# Patient Record
Sex: Male | Born: 1970 | Race: Black or African American | Hispanic: No | Marital: Married | Smoking: Former smoker
Health system: Southern US, Community
[De-identification: ages and names within clinical notes are randomized; demographics above are authoritative.]

---

## 2016-11-26 ENCOUNTER — Emergency Department (HOSPITAL_BASED_OUTPATIENT_CLINIC_OR_DEPARTMENT_OTHER): Payer: Worker's Compensation

## 2016-11-26 ENCOUNTER — Emergency Department (HOSPITAL_BASED_OUTPATIENT_CLINIC_OR_DEPARTMENT_OTHER)
Admission: EM | Admit: 2016-11-26 | Discharge: 2016-11-26 | Disposition: A | Discharge status: Home / Self Care | Payer: Worker's Compensation | Attending: Emergency Medicine | Admitting: Emergency Medicine

## 2016-11-26 ENCOUNTER — Encounter (HOSPITAL_BASED_OUTPATIENT_CLINIC_OR_DEPARTMENT_OTHER): Payer: Self-pay

## 2016-11-26 DIAGNOSIS — R0789 Other chest pain: Secondary | ICD-10-CM | POA: Insufficient documentation

## 2016-11-26 DIAGNOSIS — R072 Precordial pain: Secondary | ICD-10-CM | POA: Diagnosis present

## 2016-11-26 DIAGNOSIS — F17290 Nicotine dependence, other tobacco product, uncomplicated: Secondary | ICD-10-CM | POA: Diagnosis not present

## 2016-11-26 DIAGNOSIS — M549 Dorsalgia, unspecified: Secondary | ICD-10-CM | POA: Diagnosis not present

## 2016-11-26 LAB — CBC
HCT: 40.4 % (ref 39.0–52.0)
Hemoglobin: 14 g/dL (ref 13.0–17.0)
MCH: 29.5 pg (ref 26.0–34.0)
MCHC: 34.7 g/dL (ref 30.0–36.0)
MCV: 85.1 fL (ref 78.0–100.0)
PLATELETS: 222 10*3/uL (ref 150–400)
RBC: 4.75 MIL/uL (ref 4.22–5.81)
RDW: 12.6 % (ref 11.5–15.5)
WBC: 5.7 10*3/uL (ref 4.0–10.5)

## 2016-11-26 LAB — BASIC METABOLIC PANEL
Anion gap: 6 (ref 5–15)
BUN: 13 mg/dL (ref 6–20)
CHLORIDE: 105 mmol/L (ref 101–111)
CO2: 26 mmol/L (ref 22–32)
CREATININE: 0.99 mg/dL (ref 0.61–1.24)
Calcium: 8.7 mg/dL — ABNORMAL LOW (ref 8.9–10.3)
GFR calc non Af Amer: >60 mL/min (ref >60)
Glucose, Bld: 104 mg/dL — ABNORMAL HIGH (ref 65–99)
Potassium: 3.9 mmol/L (ref 3.5–5.1)
SODIUM: 137 mmol/L (ref 135–145)

## 2016-11-26 LAB — TROPONIN I: Troponin I: <0.03 ng/mL (ref <0.03)

## 2016-11-26 IMAGING — CT CT ANGIO CHEST
2 of 8 series · 19 of 36 positions shown · IV contrast (isovue)
Comparison: [DATE] CXR

CLINICAL DATA: Chest pain and back pain today.  Nonsmoker.

EXAM:
CT ANGIOGRAPHY CHEST WITH CONTRAST
TECHNIQUE: Multidetector CT imaging of the chest was performed using the
standard protocol during bolus administration of intravenous
contrast. Multiplanar CT image reconstructions and MIPs were
obtained to evaluate the vascular anatomy.
CONTRAST:  100 cc Isovue 370 IV

[Series 6: pe coronal mpr · coronal · 0.56mm/px · 1 of 129 slices shown]
[im 65/129  mediastinal]
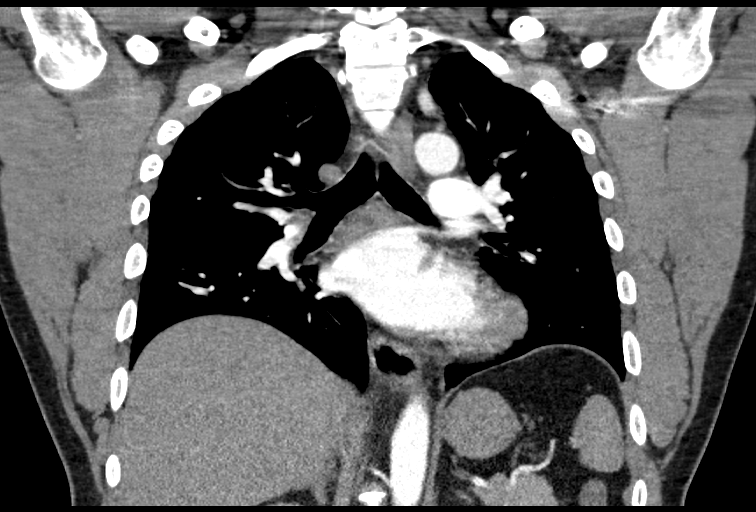

[Series 10: pe thins · axial · 0.67mm/px · z∈[-284,-36]mm · 18 of 278 slices shown]
[im 15/278  lung]
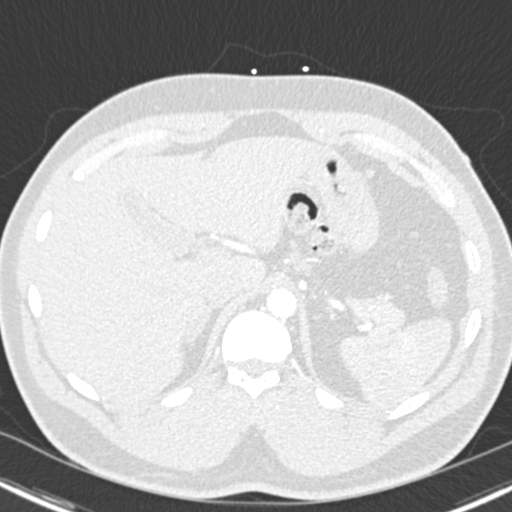
[im 30/278  mediastinal]
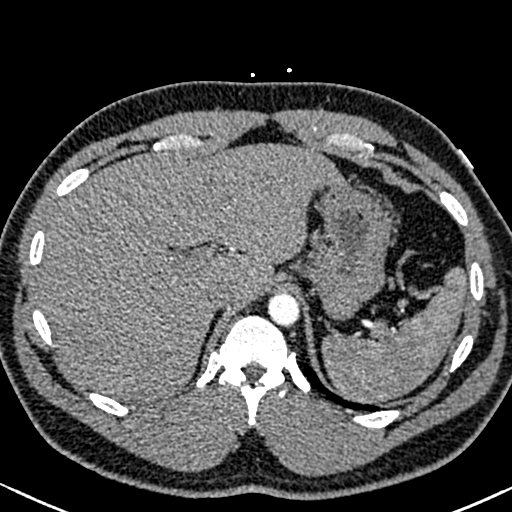
[im 44/278  lung]
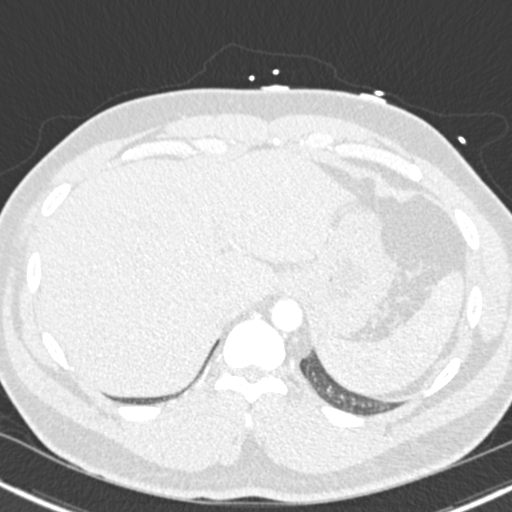
[im 59/278  mediastinal]
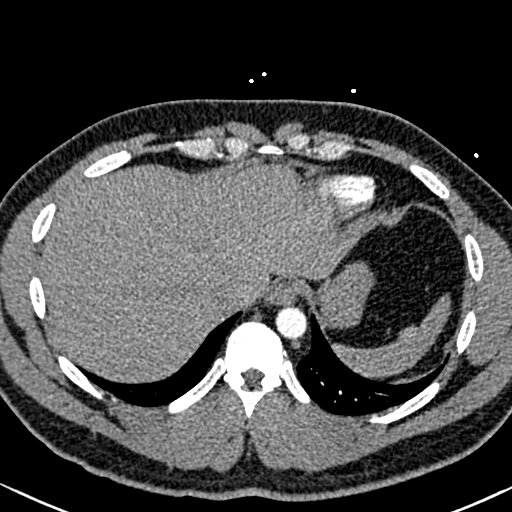
[im 73/278  lung]
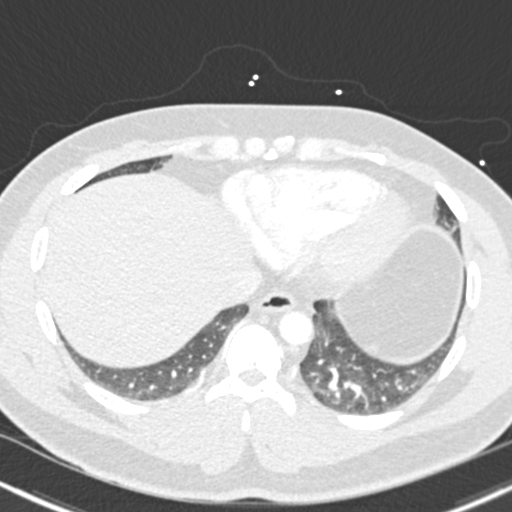
[im 88/278  mediastinal]
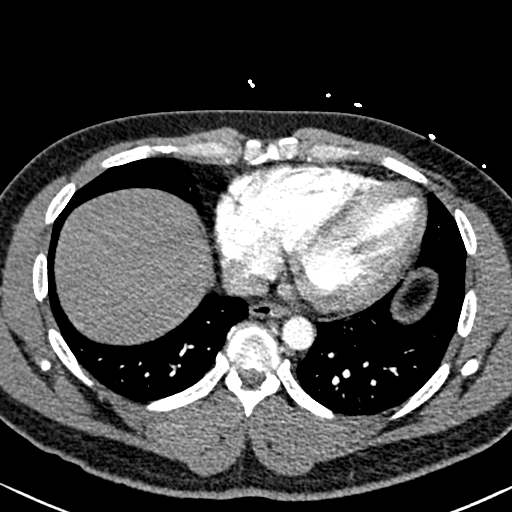
[im 103/278  lung]
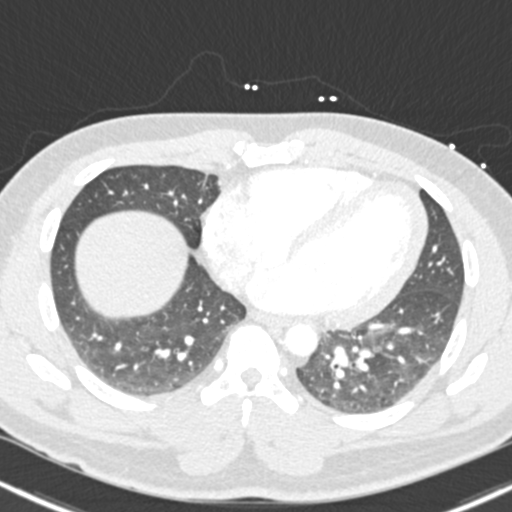
[im 117/278  mediastinal]
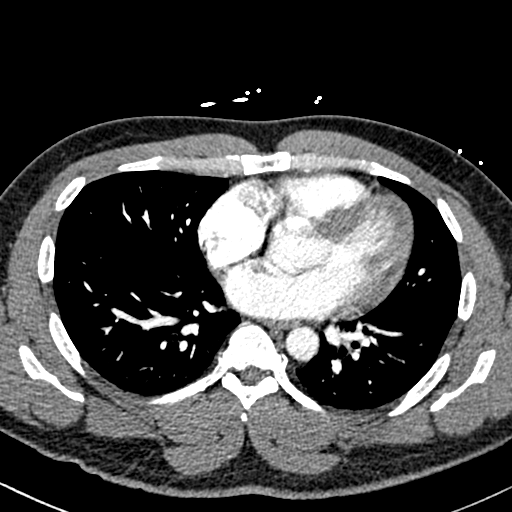
[im 132/278  lung]
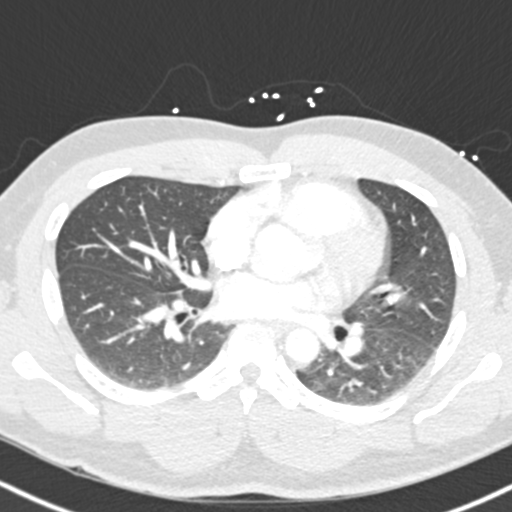
[im 146/278  mediastinal]
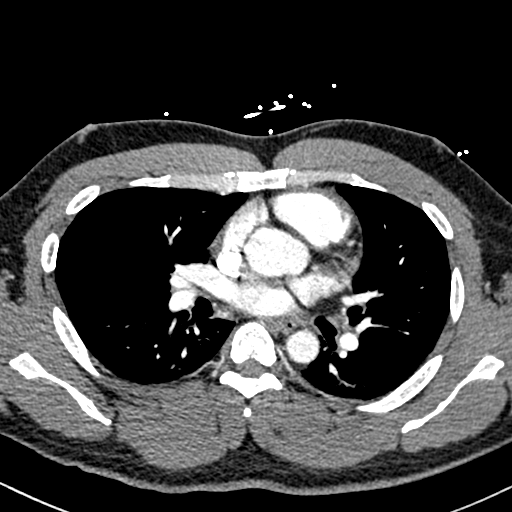
[im 161/278  lung]
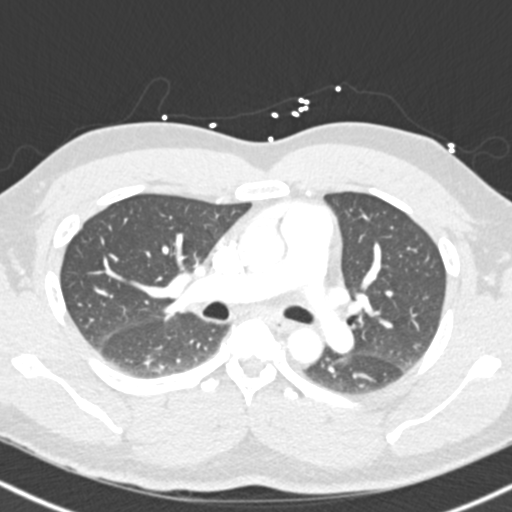
[im 175/278  mediastinal]
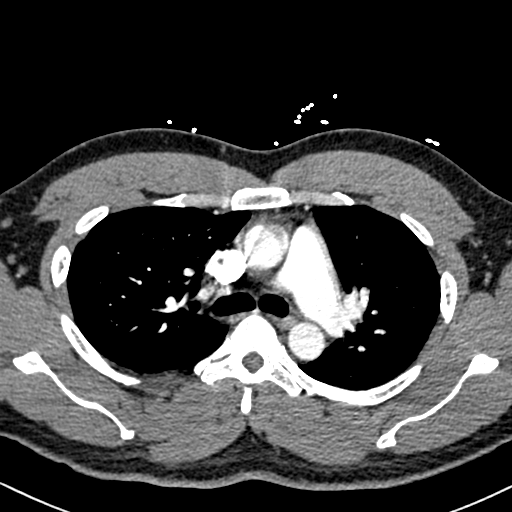
[im 190/278  lung]
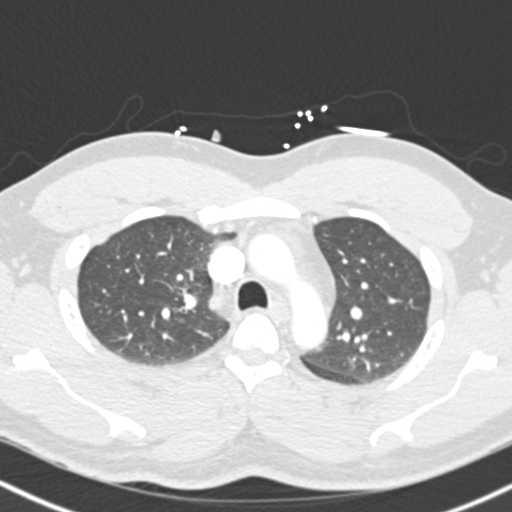
[im 205/278  mediastinal]
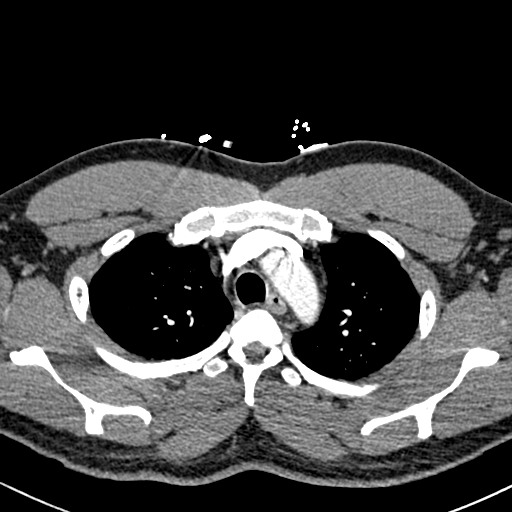
[im 219/278  lung]
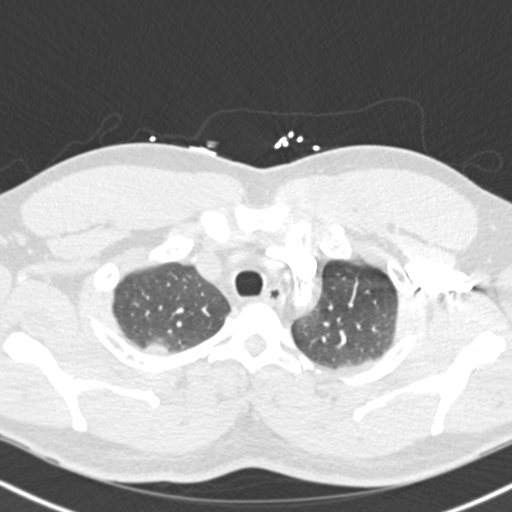
[im 234/278  mediastinal]
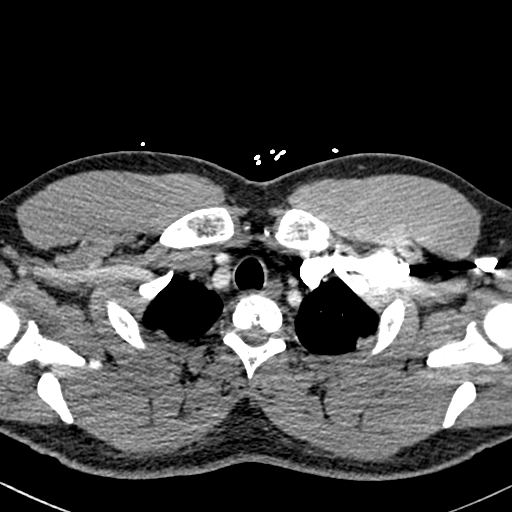
[im 248/278  lung]
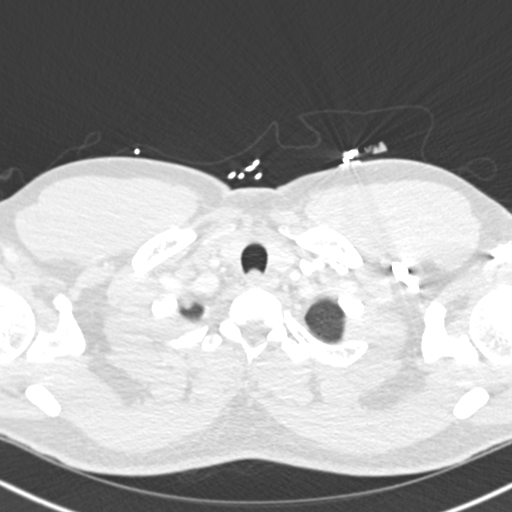
[im 263/278  mediastinal]
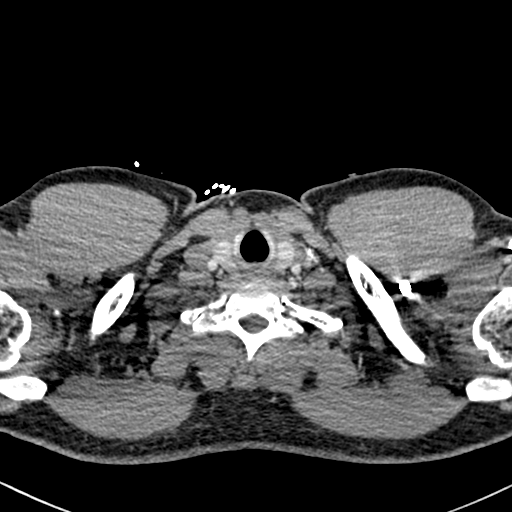

[19 of 36 positions shown; findings below may reference images not displayed]

FINDINGS: Cardiovascular: Negative for acute pulmonary embolus. Normal size
cardiac chambers. No pericardial effusion. No thoracic aortic
aneurysm or dissection.

Mediastinum/Nodes: No enlarged mediastinal, hilar, or axillary lymph
nodes. Thyroid gland, trachea, and esophagus demonstrate no
significant findings.

Lungs/Pleura: Lungs demonstrate bibasilar dependent atelectasis. No
pneumonic consolidation, effusion or pneumothorax. No dominant mass.

Upper Abdomen: No acute abnormality.

Musculoskeletal: No chest wall abnormality. No acute or significant
osseous findings.

Review of the MIP images confirms the above findings.
IMPRESSION: No acute cardiothoracic abnormality. No evidence of acute pulmonary
embolus.

## 2016-11-26 IMAGING — CR DG CHEST 2V
2 series · 2 of 2 positions shown · non-contrast
Comparison: None in PACs

CLINICAL DATA: Onset of left-sided chest pain this morning with
radiation between the shoulder blades.

EXAM:
CHEST  2 VIEW

[w chest pa]
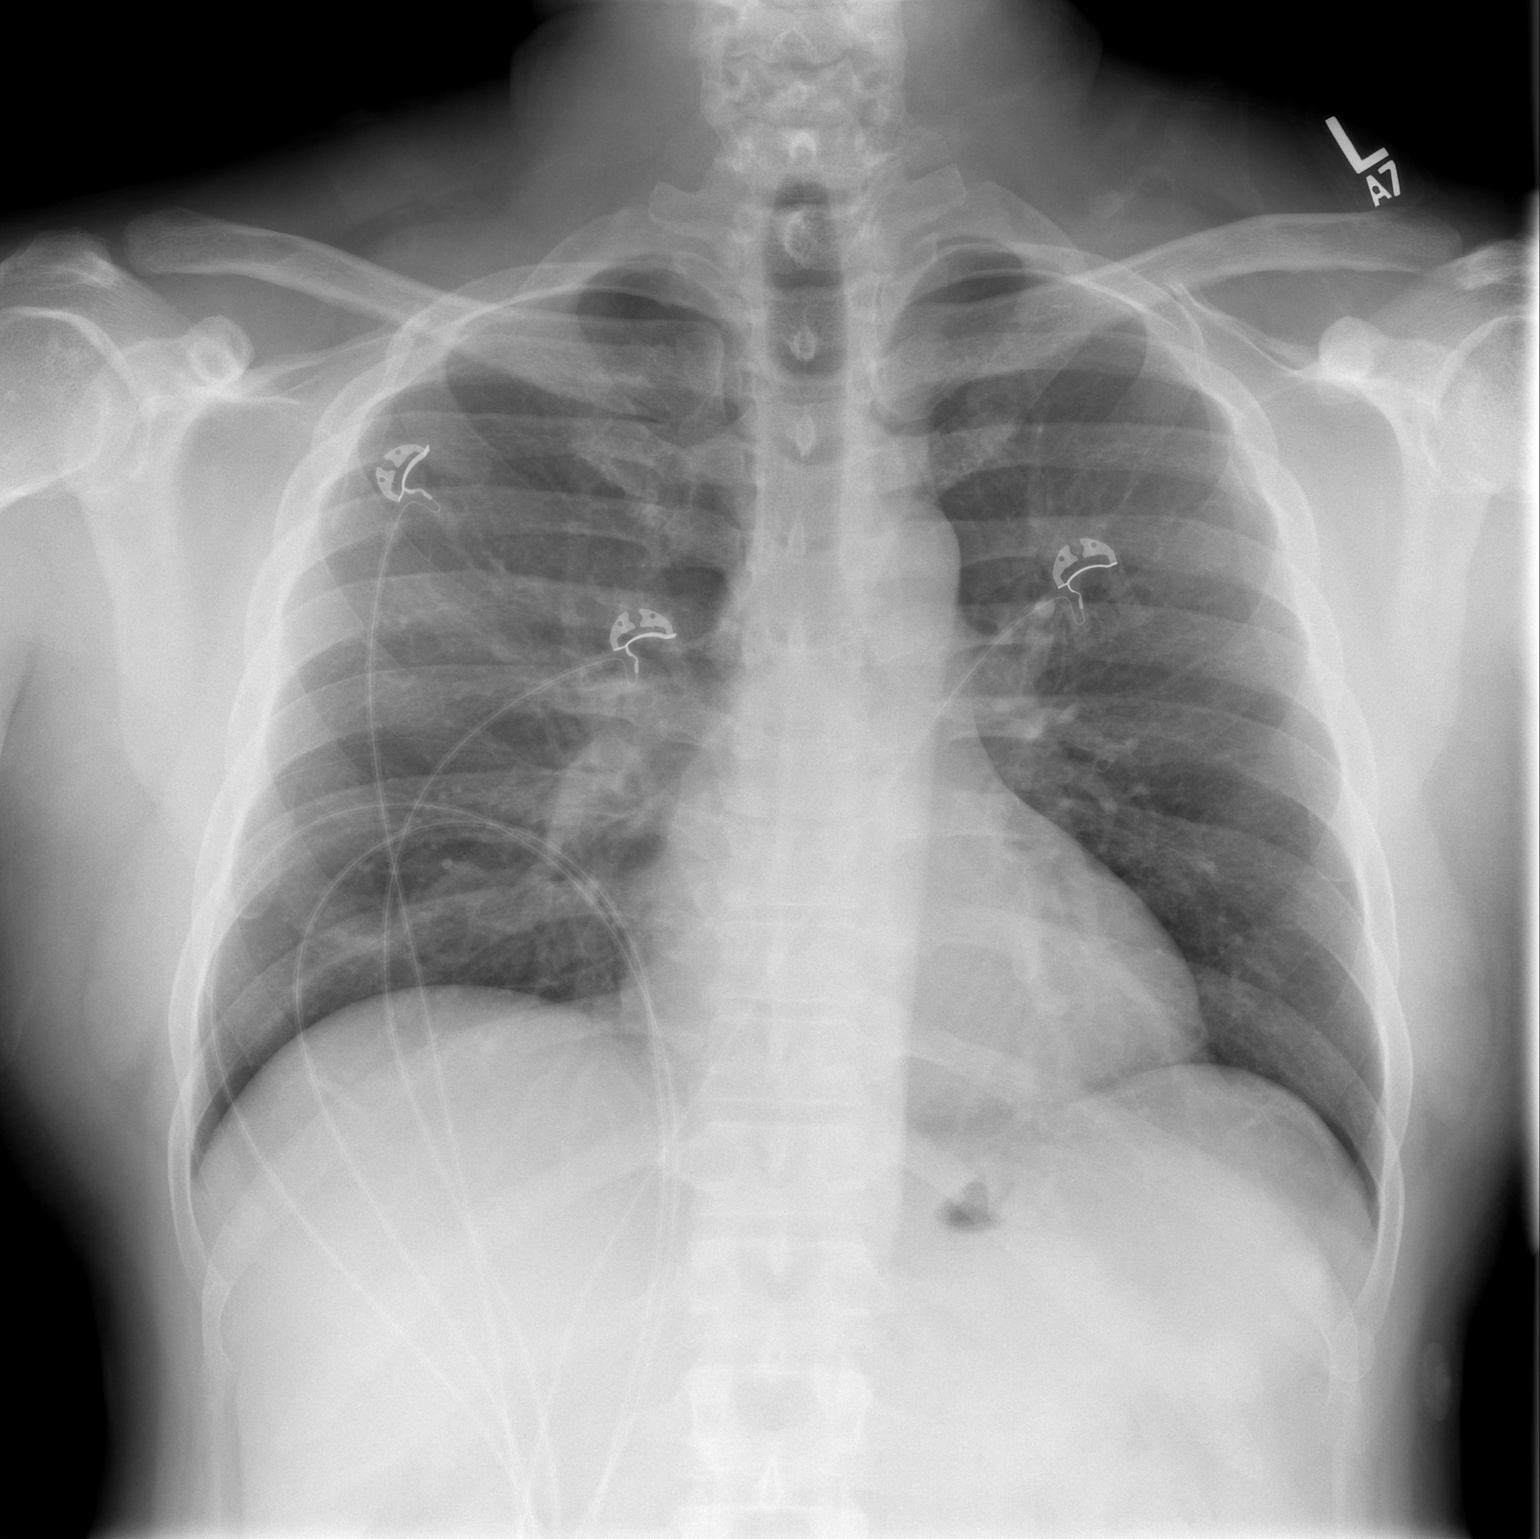

[w chest lat]
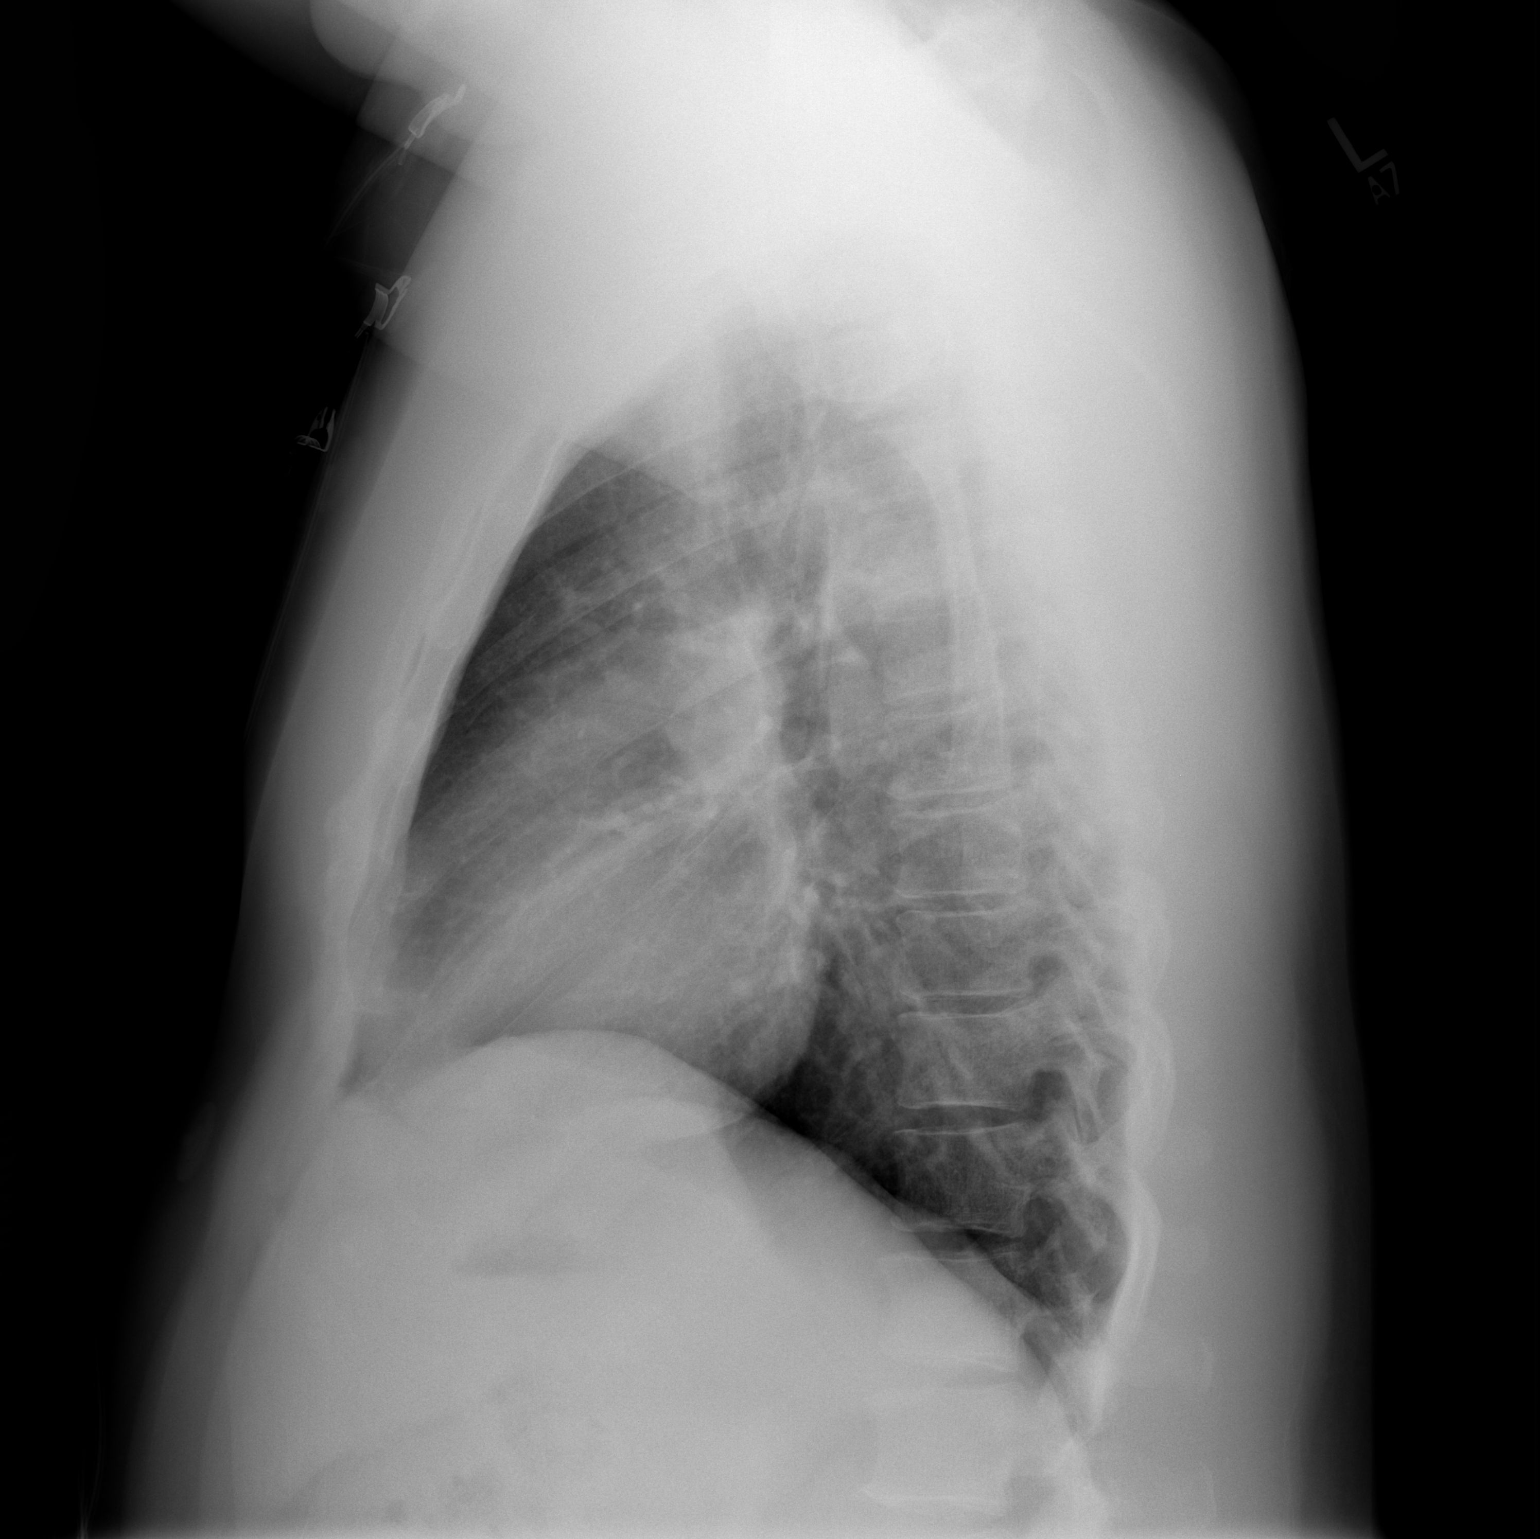

[2 of 2 positions shown; findings below may reference images not displayed]

FINDINGS: The lungs are adequately inflated. There is subtle density just
above the right hemidiaphragm. There is no pneumothorax or pleural
effusion. The heart and pulmonary vascularity are normal. The
mediastinum is normal in width.
IMPRESSION: No definite acute cardiopulmonary abnormality. Linear density just
above the dome of the right hemidiaphragm likely reflects scarring
though subsegmental atelectasis is not excluded. If the patient's
symptoms persists, chest CT scanning would be a useful next imaging
step.

## 2016-11-26 MED ORDER — NAPROXEN 500 MG PO TABS: 500.0000 mg | ORAL_TABLET | Freq: Two times a day (BID) | ORAL | 0 refills | Status: AC

## 2016-11-26 MED ORDER — IOPAMIDOL (ISOVUE-370) INJECTION 76%
100.0000 mL | Freq: Once | INTRAVENOUS | Status: AC | PRN
  Administered 2016-11-26: 100 mL via INTRAVENOUS

## 2016-11-26 MED ORDER — SODIUM CHLORIDE 0.9 % IV SOLN
INTRAVENOUS | Status: DC
  Administered 2016-11-26: 15:00:00 via INTRAVENOUS

## 2016-11-26 MED FILL — NAPROXEN 500 MG TABLET: 500 | 7 days supply | Qty: 14 | Fill #0

## 2016-11-26 NOTE — ED Provider Notes (Signed)
[facility]   CSN: [id number] Arrival date & time: 11/26/16  1227     History   Chief Complaint Chief Complaint  Patient presents with  . Chest Pain    HPI Brad Dennis is a 46 y.o. male.  Patient with onset of left-sided substernal chest pain at 8 this morning radiates to the back. Made worse with taking deep breaths and movement of his left arm. No nausea vomiting no diaphoresis. Questionable some shortness of breath. No history of injury. Past medical history noncontributory. Patient is a cigar smoker. No history of similar pain.      History reviewed. No pertinent past medical history.  There are no active problems to display for this patient.   History reviewed. No pertinent surgical history.     Home Medications    Prior to Admission medications   Medication Sig Start Date End Date Taking? Authorizing Provider  naproxen (NAPROSYN) 500 MG tablet Take 1 tablet (500 mg total) by mouth 2 (two) times daily. 11/26/16   Vanetta Mulders, MD    Family History No family history on file.  Social History Social History  Substance Use Topics  . Smoking status: Current Some Day Smoker    Types: Cigars  . Smokeless tobacco: Never Used  . Alcohol use No     Allergies   Patient has no known allergies.   Review of Systems Review of Systems  Constitutional: Negative for fever.  HENT: Negative for congestion.   Eyes: Negative for visual disturbance.  Respiratory: Negative for shortness of breath.   Cardiovascular: Positive for chest pain. Negative for leg swelling.  Gastrointestinal: Negative for abdominal pain, nausea and vomiting.  Genitourinary: Negative for dysuria.  Musculoskeletal: Positive for back pain.  Neurological: Negative for headaches.  Hematological: Does not bruise/bleed easily.  Psychiatric/Behavioral: Negative for confusion.     Physical Exam Updated Vital Signs BP 134/82 (BP Location: Right Arm)   Pulse  66   Temp 98.8 F (37.1 C) (Oral)   Resp 18   Ht 1.829 m (6')   Wt 103 kg (227 lb)   SpO2 100%   BMI 30.79 kg/m   Physical Exam  Constitutional: He is oriented to person, place, and time. He appears well-developed and well-nourished. No distress.  HENT:  Head: Normocephalic and atraumatic.  Mouth/Throat: Oropharynx is clear and moist.  Eyes: Conjunctivae and EOM are normal. Pupils are equal, round, and reactive to light.  Neck: Normal range of motion. Neck supple.  Cardiovascular: Normal rate, regular rhythm and normal heart sounds.   Pulmonary/Chest: Effort normal and breath sounds normal. No respiratory distress. He exhibits no tenderness.  Abdominal: Soft. Bowel sounds are normal. There is no tenderness.  Musculoskeletal: Normal range of motion. He exhibits no edema.  Neurological: He is alert and oriented to person, place, and time. No cranial nerve deficit or sensory deficit. He exhibits normal muscle tone. Coordination normal.  Skin: Skin is warm.  Nursing note and vitals reviewed.    ED Treatments / Results  Labs (all labs ordered are listed, but only abnormal results are displayed) Labs Reviewed  BASIC METABOLIC PANEL - Abnormal; Notable for the following:       Result Value   Glucose, Bld 104 (*)    Calcium 8.7 (*)    All other components within normal limits  CBC  TROPONIN I    EKG  EKG Interpretation  Date/Time:  Tuesday Nov 26 2016 12:36:16 EDT Ventricular Rate:  67 PR Interval:  162 QRS Duration: 96 QT Interval:  390 QTC Calculation: 412 R Axis:   59 Text Interpretation:  Normal sinus rhythm Cannot rule out Anterior infarct , age undetermined Abnormal ECG No previous ECGs available Confirmed by Vanetta MuldersZackowski, Shayn Madole [phone]([id number]) on 11/26/2016 12:37:22 PM       Radiology Dg Chest 2 View  Result Date: 11/26/2016 CLINICAL DATA:  Onset of left-sided chest pain this morning with radiation between the shoulder blades. EXAM: CHEST  2 VIEW COMPARISON:  None in PACs  FINDINGS: The lungs are adequately inflated. There is subtle density just above the right hemidiaphragm. There is no pneumothorax or pleural effusion. The heart and pulmonary vascularity are normal. The mediastinum is normal in width. IMPRESSION: No definite acute cardiopulmonary abnormality. Linear density just above the dome of the right hemidiaphragm likely reflects scarring though subsegmental atelectasis is not excluded. If the patient's symptoms persists, chest CT scanning would be a useful next imaging step. Electronically Signed   By: David  SwazilandJordan M.D.   On: 11/26/2016 12:54   Ct Angio Chest Pe W/cm &/or Wo Cm  Result Date: 11/26/2016 CLINICAL DATA:  Chest pain and back pain today.  Nonsmoker. EXAM: CT ANGIOGRAPHY CHEST WITH CONTRAST TECHNIQUE: Multidetector CT imaging of the chest was performed using the standard protocol during bolus administration of intravenous contrast. Multiplanar CT image reconstructions and MIPs were obtained to evaluate the vascular anatomy. CONTRAST:  100 cc Isovue 370 IV COMPARISON:  11/26/2016 CXR FINDINGS: Cardiovascular: Negative for acute pulmonary embolus. Normal size cardiac chambers. No pericardial effusion. No thoracic aortic aneurysm or dissection. Mediastinum/Nodes: No enlarged mediastinal, hilar, or axillary lymph nodes. Thyroid gland, trachea, and esophagus demonstrate no significant findings. Lungs/Pleura: Lungs demonstrate bibasilar dependent atelectasis. No pneumonic consolidation, effusion or pneumothorax. No dominant mass. Upper Abdomen: No acute abnormality. Musculoskeletal: No chest wall abnormality. No acute or significant osseous findings. Review of the MIP images confirms the above findings. IMPRESSION: No acute cardiothoracic abnormality. No evidence of acute pulmonary embolus. Electronically Signed   By: Tollie Ethavid  Kwon M.D.   On: 11/26/2016 14:47    Procedures Procedures (including critical care time)  Medications Ordered in ED Medications  0.9 %   sodium chloride infusion ( Intravenous New Bag/Given 11/26/16 1436)  iopamidol (ISOVUE-370) 76 % injection 100 mL (100 mLs Intravenous Contrast Given 11/26/16 1425)     Initial Impression / Assessment and Plan / ED Course  I have reviewed the triage vital signs and the nursing notes.  Pertinent labs & imaging results that were available during my care of the patient were reviewed by me and considered in my medical decision making (see chart for details).    The patient has left-sided chest pain. Made worse by movement of his left arm or by taking a deep breath. First troponin negative EKG without acute changes. Not concerned about an acute cardiac event. Initial concern was more for possible pulmonary embolus. Since chest x-ray raises some concern for scarring of the lung CT angios was done. No evidence of pulmonary embolus.  Patient was treated as chest wall pain with Naprosyn and a work note. He'll return for any new or worse symptoms.   Final Clinical Impressions(s) / ED Diagnoses   Final diagnoses:  Chest wall pain    New Prescriptions New Prescriptions   NAPROXEN (NAPROSYN) 500 MG TABLET    Take 1 tablet (500 mg total) by mouth 2 (two) times daily.     Vanetta MuldersZackowski, Viraat Vanpatten, MD 11/26/16 [phone]

## 2016-11-26 NOTE — ED Notes (Signed)
Patient transported to CT 

## 2016-11-26 NOTE — ED Notes (Signed)
Patient transported to X-ray 

## 2016-11-26 NOTE — ED Triage Notes (Signed)
C/o CP x today-NAD-steady gait 

## 2016-11-26 NOTE — Discharge Instructions (Signed)
Work note provided. Workup for the chest pain without evidence of any heart problem or blood clots in the lungs or any significant lung abnormalities. Seems to be consistent with chest wall pain. Take the Naprosyn as directed for the next few days. Return for any new or worse symptoms.

## 2017-10-15 ENCOUNTER — Ambulatory Visit (INDEPENDENT_AMBULATORY_CARE_PROVIDER_SITE_OTHER): Payer: Self-pay

## 2017-10-15 ENCOUNTER — Other Ambulatory Visit: Payer: Self-pay | Admitting: Gerontology

## 2017-10-15 DIAGNOSIS — R0789 Other chest pain: Secondary | ICD-10-CM

## 2017-10-15 DIAGNOSIS — R52 Pain, unspecified: Secondary | ICD-10-CM

## 2017-10-15 IMAGING — DX DG RIBS W/ CHEST 3+V*R*
3 series · 3 of 3 positions shown · non-contrast
Comparison: [DATE]

CLINICAL DATA: Fell today with right-sided chest wall pain.

EXAM:
RIGHT RIBS AND CHEST - 3+ VIEW

[chest pa]
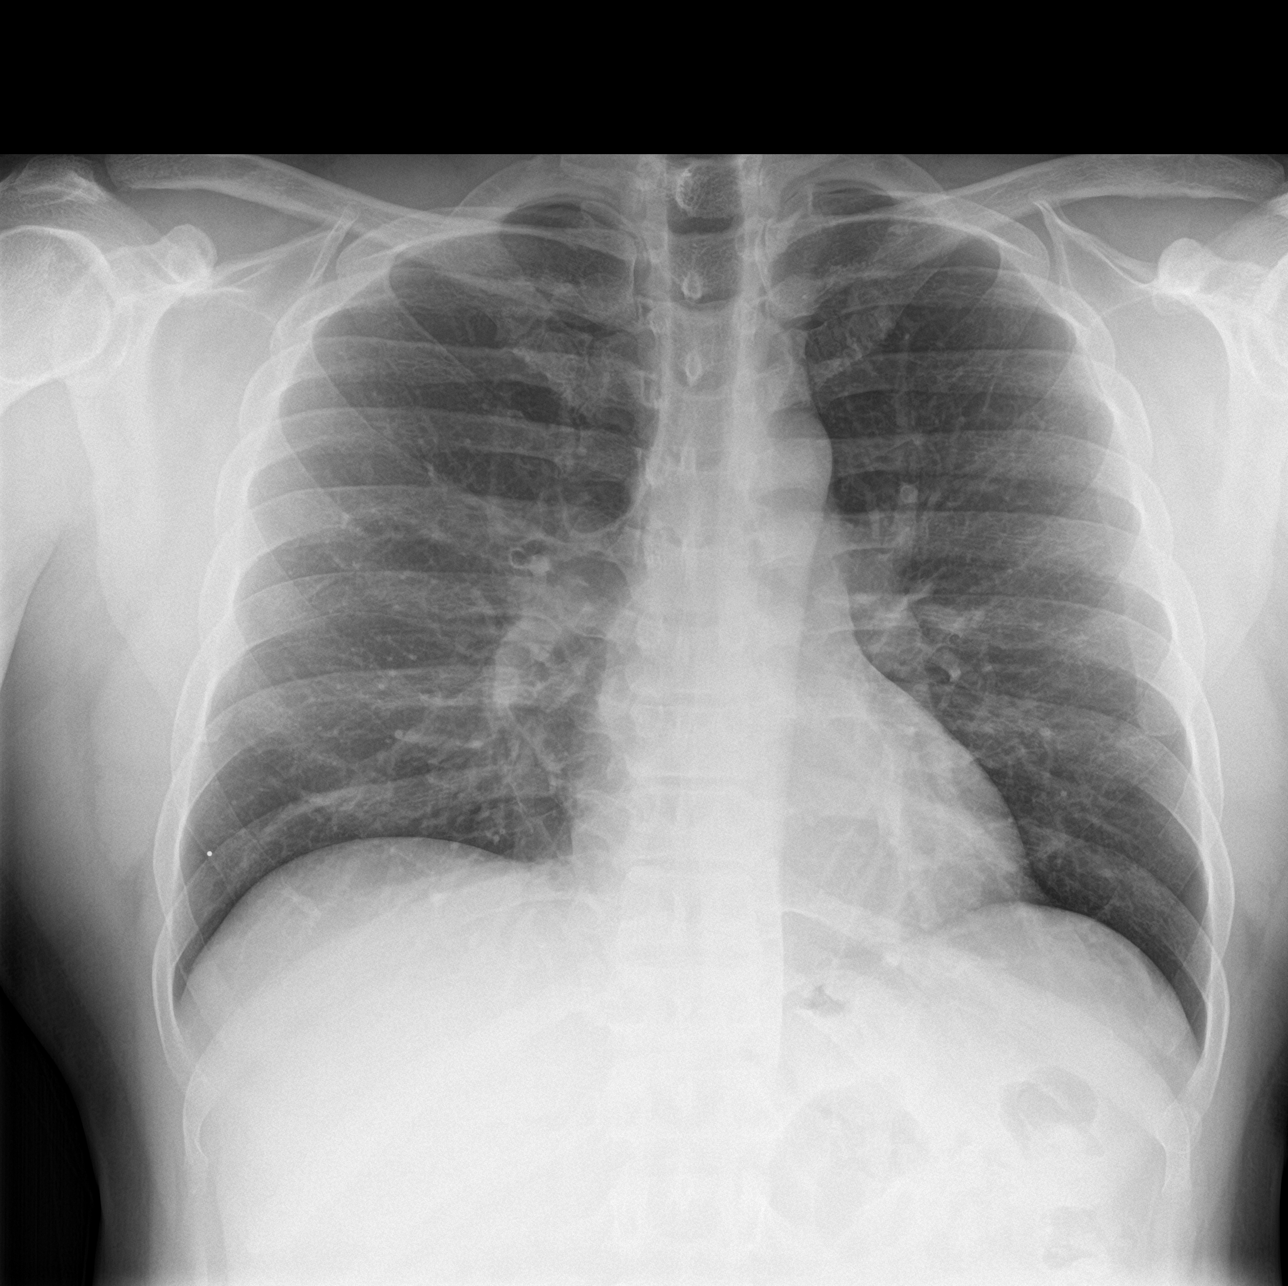

[rib pa]
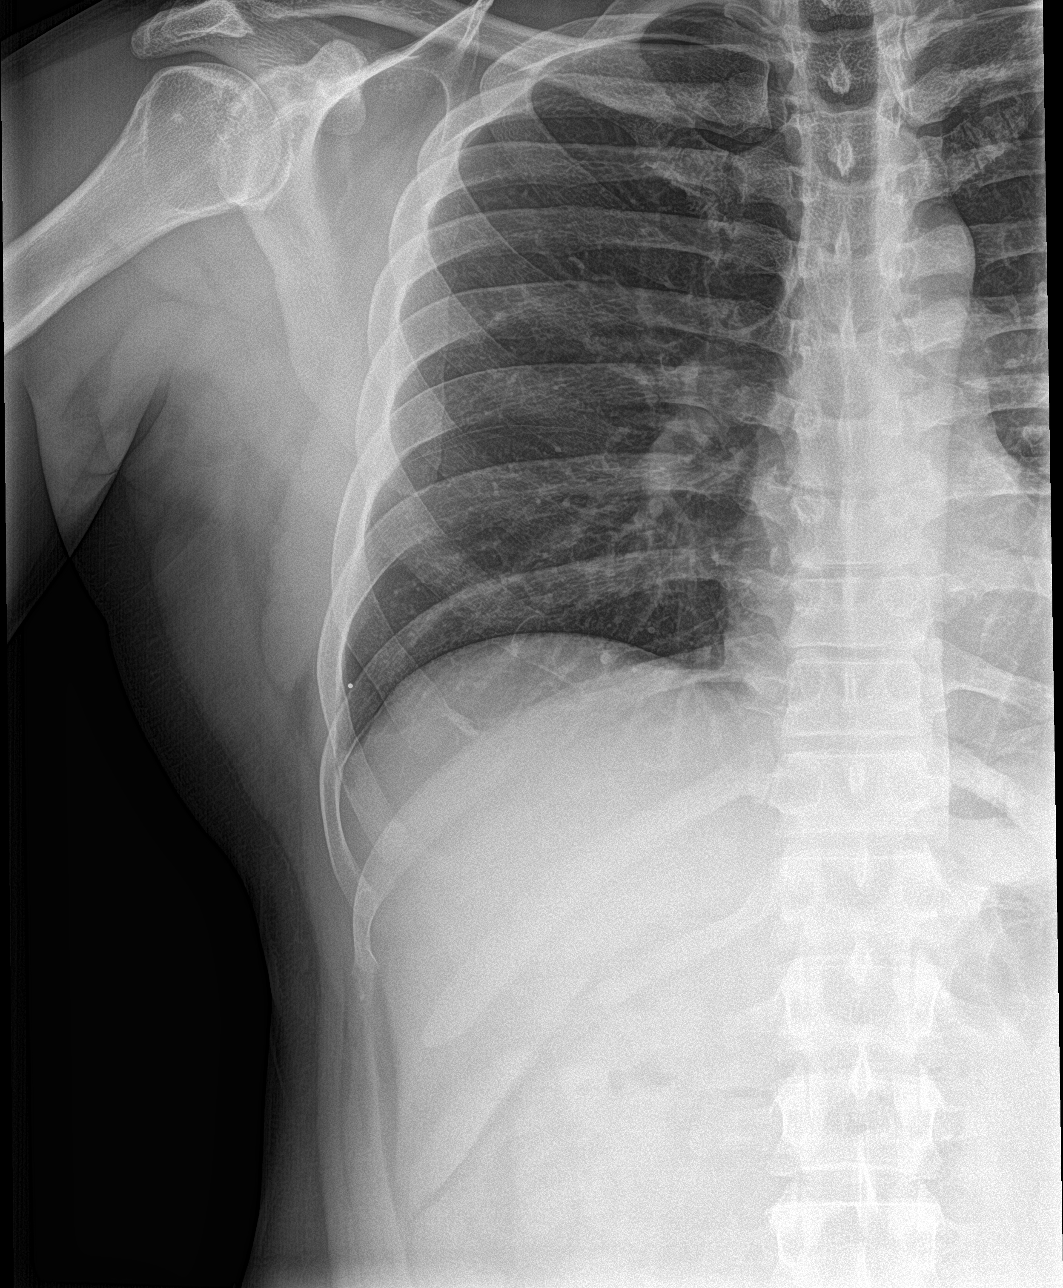

[rib pa obl]
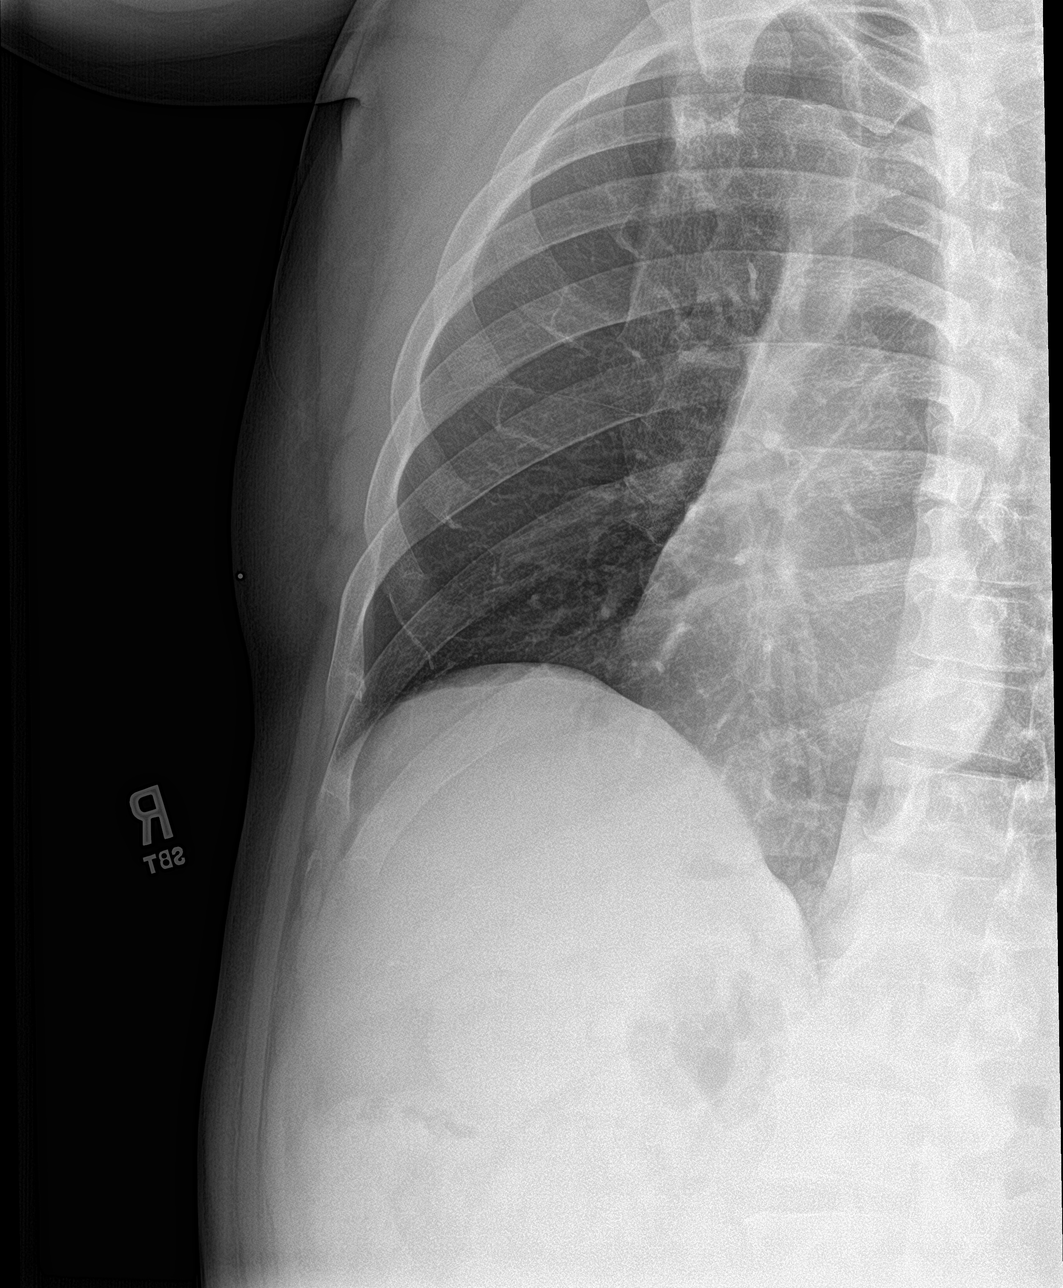

[3 of 3 positions shown; findings below may reference images not displayed]

FINDINGS: Heart size is normal. Mediastinal shadows are normal. The lungs are
clear. No pneumothorax or hemothorax. No evidence of right rib
fracture. Marker placed in the region of concern.
IMPRESSION: Normal radiographs.

## 2018-09-11 ENCOUNTER — Emergency Department (HOSPITAL_BASED_OUTPATIENT_CLINIC_OR_DEPARTMENT_OTHER)
Admission: EM | Admit: 2018-09-11 | Discharge: 2018-09-11 | Disposition: A | Discharge status: Home / Self Care | Payer: BLUE CROSS/BLUE SHIELD | Attending: Emergency Medicine | Admitting: Emergency Medicine

## 2018-09-11 ENCOUNTER — Other Ambulatory Visit: Payer: Self-pay

## 2018-09-11 ENCOUNTER — Encounter (HOSPITAL_BASED_OUTPATIENT_CLINIC_OR_DEPARTMENT_OTHER): Payer: Self-pay | Admitting: Emergency Medicine

## 2018-09-11 ENCOUNTER — Emergency Department (HOSPITAL_BASED_OUTPATIENT_CLINIC_OR_DEPARTMENT_OTHER): Payer: BLUE CROSS/BLUE SHIELD

## 2018-09-11 DIAGNOSIS — K529 Noninfective gastroenteritis and colitis, unspecified: Secondary | ICD-10-CM

## 2018-09-11 DIAGNOSIS — F17290 Nicotine dependence, other tobacco product, uncomplicated: Secondary | ICD-10-CM | POA: Diagnosis not present

## 2018-09-11 DIAGNOSIS — A09 Infectious gastroenteritis and colitis, unspecified: Secondary | ICD-10-CM

## 2018-09-11 DIAGNOSIS — R1032 Left lower quadrant pain: Secondary | ICD-10-CM | POA: Diagnosis present

## 2018-09-11 LAB — URINALYSIS, ROUTINE W REFLEX MICROSCOPIC
Bilirubin Urine: NEGATIVE
GLUCOSE, UA: NEGATIVE mg/dL
KETONES UR: NEGATIVE mg/dL
LEUKOCYTE UA: NEGATIVE
NITRITE: NEGATIVE
PH: 6 (ref 5.0–8.0)
Protein, ur: NEGATIVE mg/dL
SPECIFIC GRAVITY, URINE: 1.02 (ref 1.005–1.030)

## 2018-09-11 LAB — COMPREHENSIVE METABOLIC PANEL
ALT: 30 U/L (ref 0–44)
AST: 21 U/L (ref 15–41)
Albumin: 4.2 g/dL (ref 3.5–5.0)
Alkaline Phosphatase: 90 U/L (ref 38–126)
Anion gap: 6 (ref 5–15)
BUN: 9 mg/dL (ref 6–20)
CHLORIDE: 106 mmol/L (ref 98–111)
CO2: 24 mmol/L (ref 22–32)
CREATININE: 0.95 mg/dL (ref 0.61–1.24)
Calcium: 8.8 mg/dL — ABNORMAL LOW (ref 8.9–10.3)
GFR calc Af Amer: >60 mL/min (ref >60)
Glucose, Bld: 105 mg/dL — ABNORMAL HIGH (ref 70–99)
POTASSIUM: 3.4 mmol/L — AB (ref 3.5–5.1)
SODIUM: 136 mmol/L (ref 135–145)
Total Bilirubin: 1.1 mg/dL (ref 0.3–1.2)
Total Protein: 7.4 g/dL (ref 6.5–8.1)

## 2018-09-11 LAB — C DIFFICILE QUICK SCREEN W PCR REFLEX
C DIFFICILE (CDIFF) INTERP: NOT DETECTED
C DIFFICLE (CDIFF) ANTIGEN: NEGATIVE
C Diff toxin: NEGATIVE

## 2018-09-11 LAB — URINALYSIS, MICROSCOPIC (REFLEX): SQUAMOUS EPITHELIAL / LPF: NONE SEEN (ref 0–5)

## 2018-09-11 LAB — CBC
HEMATOCRIT: 45 % (ref 39.0–52.0)
HEMOGLOBIN: 14.7 g/dL (ref 13.0–17.0)
MCH: 28.4 pg (ref 26.0–34.0)
MCHC: 32.7 g/dL (ref 30.0–36.0)
MCV: 86.9 fL (ref 80.0–100.0)
NRBC: 0 % (ref 0.0–0.2)
Platelets: 253 10*3/uL (ref 150–400)
RBC: 5.18 MIL/uL (ref 4.22–5.81)
RDW: 12.3 % (ref 11.5–15.5)
WBC: 10.8 10*3/uL — AB (ref 4.0–10.5)

## 2018-09-11 LAB — OCCULT BLOOD X 1 CARD TO LAB, STOOL: FECAL OCCULT BLD: POSITIVE — AB

## 2018-09-11 LAB — LIPASE, BLOOD: LIPASE: 28 U/L (ref 11–51)

## 2018-09-11 IMAGING — CT CT ABDOMEN AND PELVIS WITH CONTRAST
2 of 5 series · 16 of 46 positions shown, 18 images · IV contrast (APPLIED)
Comparison: [DATE]

CLINICAL DATA: Left lower quadrant pain for 2 weeks and diarrhea
beginning after receiving antibiotics for a root canal.

EXAM:
CT ABDOMEN AND PELVIS WITH CONTRAST
TECHNIQUE: Multidetector CT imaging of the abdomen and pelvis was performed
using the standard protocol following bolus administration of
intravenous contrast.
CONTRAST:  100mL OMNIPAQUE IOHEXOL 300 MG/ML  SOLN

[Series 2: axial st · axial · 0.92mm/px · z∈[-467,-17]mm · 13 of 101 slices shown, 15 images]
[im 6/101  soft-tissue]
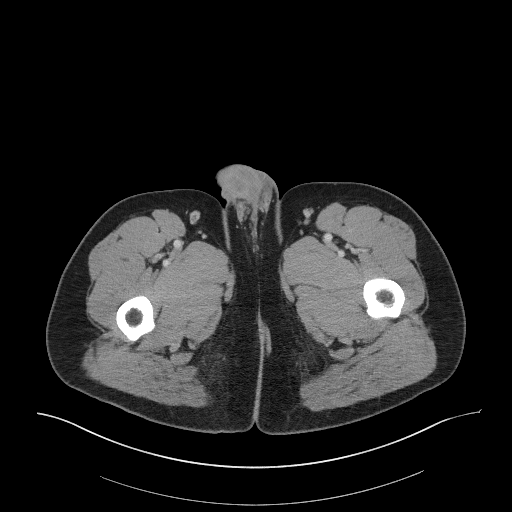
[im 6/101  bone]
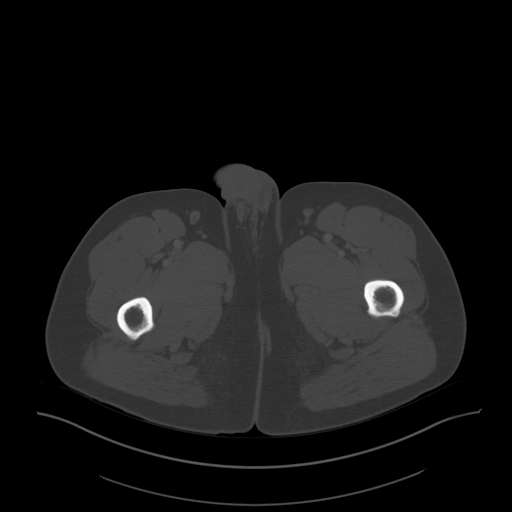
[im 16/101  soft-tissue]
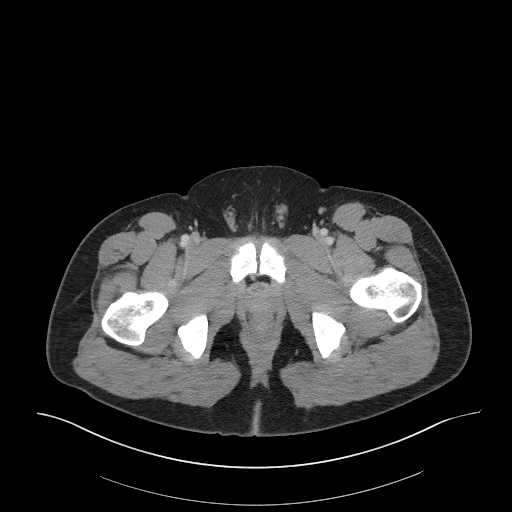
[im 21/101  soft-tissue]
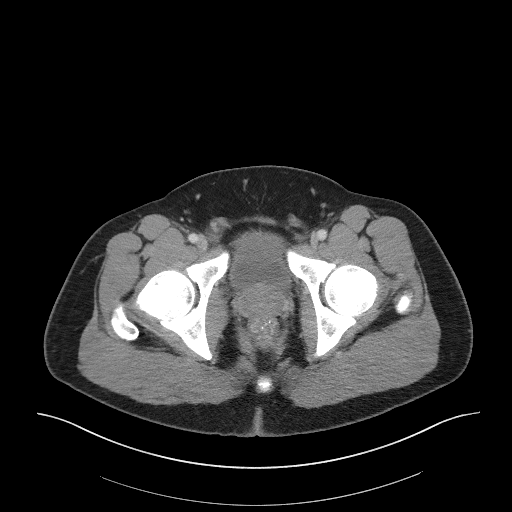
[im 31/101  soft-tissue]
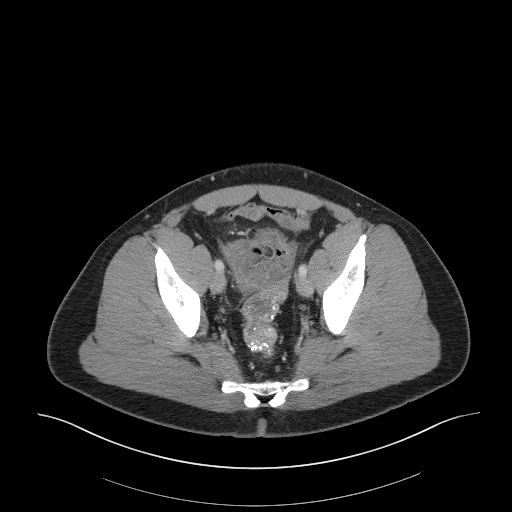
[im 36/101  soft-tissue]
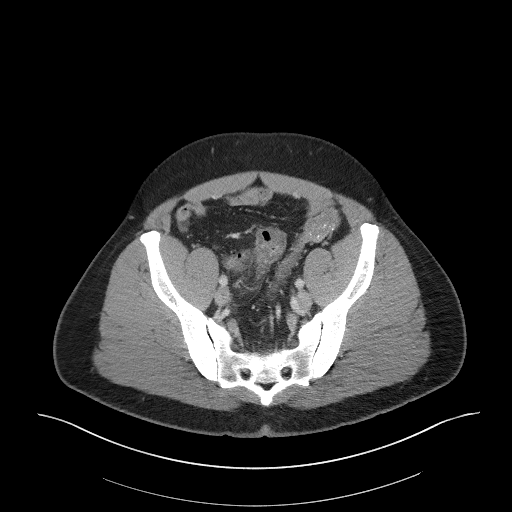
[im 46/101  soft-tissue]
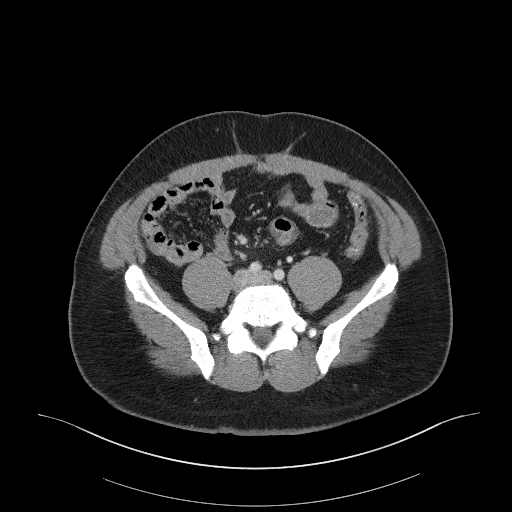
[im 51/101  soft-tissue]
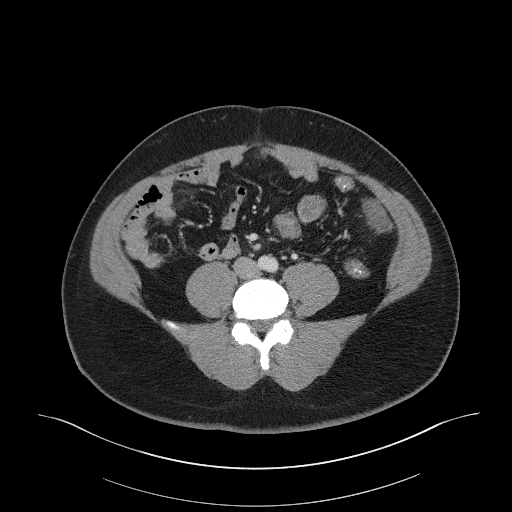
[im 56/101  soft-tissue]
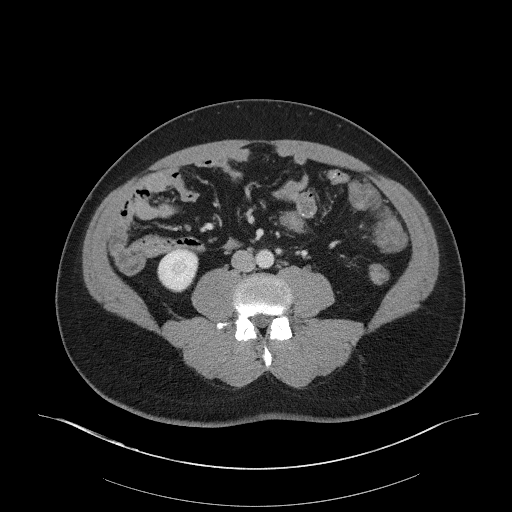
[im 66/101  soft-tissue]
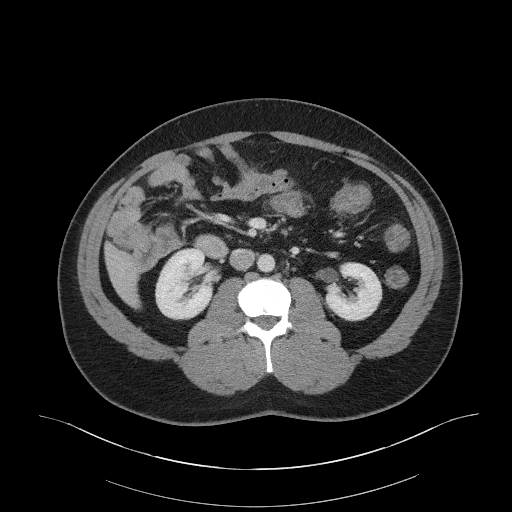
[im 66/101  bone]
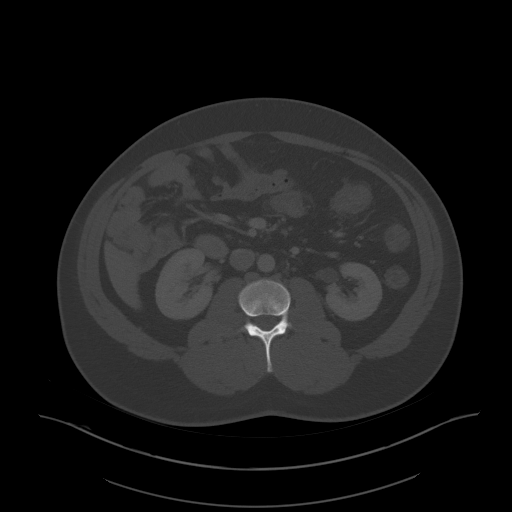
[im 71/101  soft-tissue]
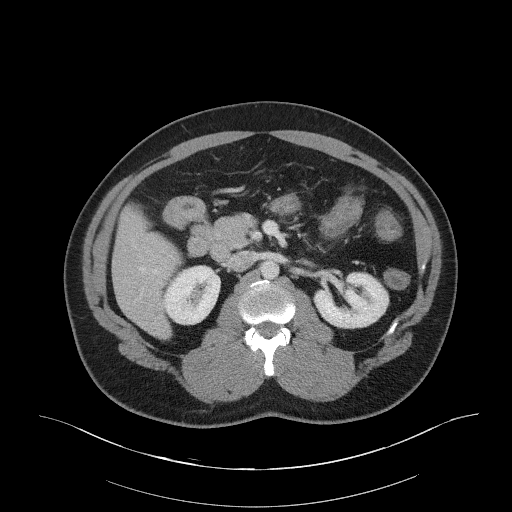
[im 81/101  soft-tissue]
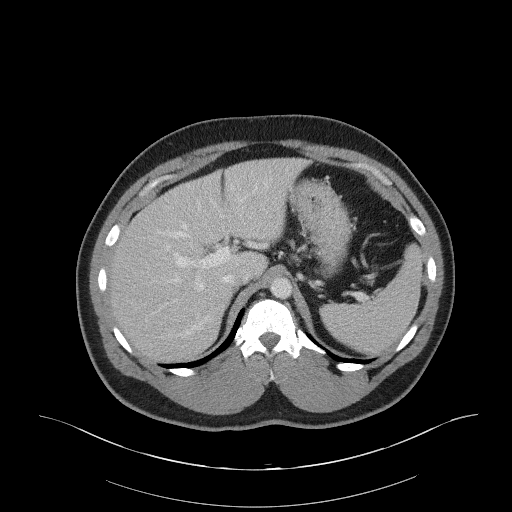
[im 86/101  soft-tissue]
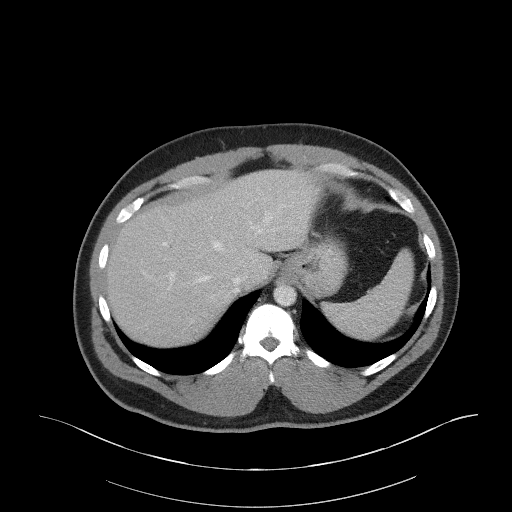
[im 96/101  soft-tissue]
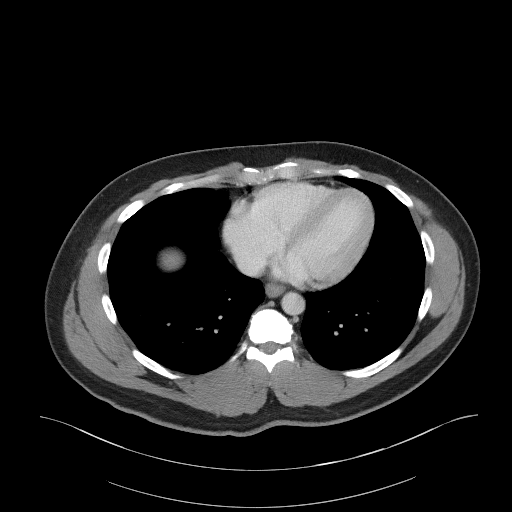

[Series 5: coronal st · coronal · 0.79mm/px · 3 of 101 slices shown]
[im 34/101  soft-tissue]
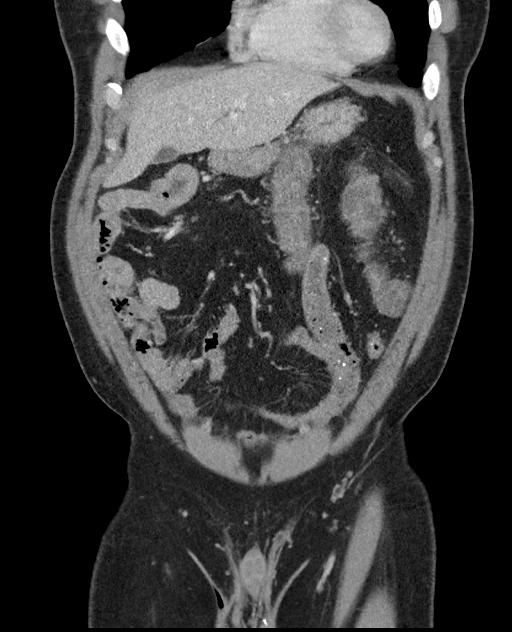
[im 45/101  soft-tissue]
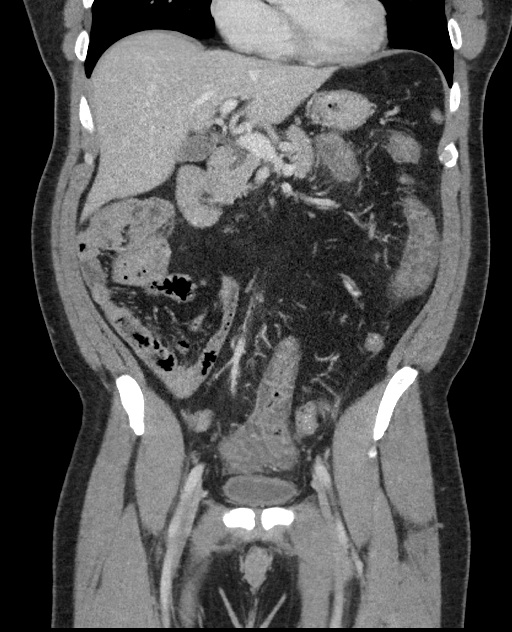
[im 56/101  soft-tissue]
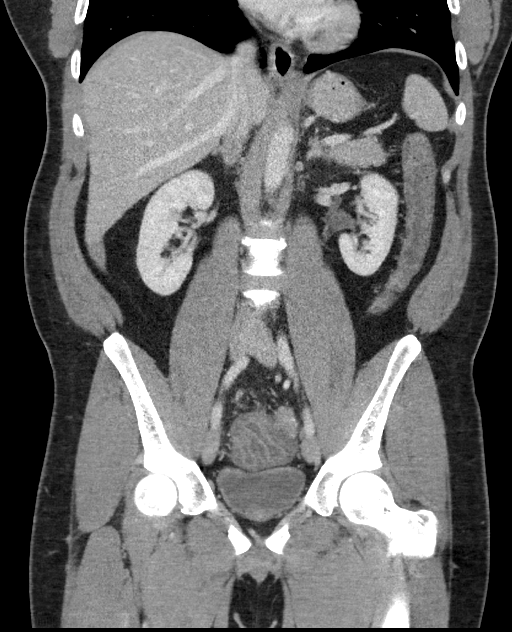

[16 of 46 positions shown; findings below may reference images not displayed]

FINDINGS: Lower chest: Clear lung bases.

Hepatobiliary: No focal liver abnormality is seen. No gallstones,
gallbladder wall thickening, or biliary dilatation.

Pancreas: Unremarkable.

Spleen: Unremarkable.

Adrenals/Urinary Tract: Unremarkable adrenal glands. No evidence of
renal mass, calculi, or hydronephrosis. Unremarkable bladder.

Stomach/Bowel: The stomach is largely collapsed. There is intestinal
malrotation with the small bowel being located in the right abdomen
and the colon in the left abdomen. The cecum is located centrally in
the lower pelvis. There is prominent circumferential colonic wall
thickening extending continuously from the cecum to the splenic
flexure with mild surrounding inflammatory stranding. A small amount
of stool is present in the more distal colon. There is no small
bowel dilatation. The appendix is not clearly identified.

Vascular/Lymphatic: No significant vascular findings are present. No
enlarged abdominal or pelvic lymph nodes.

Reproductive: Unremarkable prostate. Prior vasectomy.

Other: No ascites or pneumoperitoneum.

Musculoskeletal: No acute osseous abnormality or suspicious osseous
lesion.
IMPRESSION: 1. Diffuse proximal colonic wall thickening extending to the splenic
flexure consistent with colitis, likely infectious (including
possible C. difficile). No perforation or abscess.
2. Congenital intestinal malrotation.

## 2018-09-11 MED ORDER — ONDANSETRON HCL 4 MG PO TABS: 4.0000 mg | ORAL_TABLET | Freq: Four times a day (QID) | ORAL | 0 refills | Status: DC

## 2018-09-11 MED ORDER — SODIUM CHLORIDE 0.9 % IV BOLUS
1000.0000 mL | Freq: Once | INTRAVENOUS | Status: AC
  Administered 2018-09-11: 1000 mL via INTRAVENOUS

## 2018-09-11 MED ORDER — IOHEXOL 300 MG/ML  SOLN
100.0000 mL | Freq: Once | INTRAMUSCULAR | Status: AC | PRN
  Administered 2018-09-11: 100 mL via INTRAVENOUS

## 2018-09-11 MED ORDER — PANTOPRAZOLE SODIUM 40 MG IV SOLR
40.0000 mg | Freq: Once | INTRAVENOUS | Status: AC
  Administered 2018-09-11: 40 mg via INTRAVENOUS
  Filled 2018-09-11: qty 40

## 2018-09-11 MED ORDER — HYDROCODONE-ACETAMINOPHEN 5-325 MG PO TABS: 1.0000 | ORAL_TABLET | Freq: Four times a day (QID) | ORAL | 0 refills | Status: DC | PRN

## 2018-09-11 MED ORDER — MORPHINE SULFATE (PF) 4 MG/ML IV SOLN
4.0000 mg | Freq: Once | INTRAVENOUS | Status: DC
  Filled 2018-09-11: qty 1

## 2018-09-11 MED ORDER — ONDANSETRON HCL 4 MG/2ML IJ SOLN
4.0000 mg | Freq: Once | INTRAMUSCULAR | Status: AC
  Administered 2018-09-11: 4 mg via INTRAVENOUS
  Filled 2018-09-11: qty 2

## 2018-09-11 MED ORDER — VANCOMYCIN HCL 125 MG PO CAPS: 125.0000 mg | ORAL_CAPSULE | Freq: Four times a day (QID) | ORAL | 0 refills | Status: DC

## 2018-09-11 MED ORDER — METRONIDAZOLE 500 MG PO TABS: 500.0000 mg | ORAL_TABLET | Freq: Three times a day (TID) | ORAL | 0 refills | Status: AC

## 2018-09-11 MED FILL — metroNIDAZOLE 500 MG TABS: 500 | 10 days supply | Qty: 30 | Fill #0

## 2018-09-11 MED FILL — ONDANSETRON HCL 4 MG TABLET: 4 | 3 days supply | Qty: 9 | Fill #0

## 2018-09-11 MED FILL — HYDROCODON-APAP 5-325: 5-325 | 3 days supply | Qty: 10 | Fill #0

## 2018-09-11 NOTE — Discharge Instructions (Signed)
Evaluated today for abdominal pain as well as diarrhea.  We are treating you for presumed C. difficile.  And infection.  You will take oral vancomycin 4 times a day for 10 days.  Please make sure to wash your hands as hand sanitizer does not work for this type of infection.  I would also recommend using a separate restroom as your family.  If you use your a restaurant I recommend cleaning the toilet bowel and handles with a mixture of bleach and water when you use the restroom.  Your C. difficile testing well results over the next 24 hours. Follow up with PCP for reevaluation.  Emergency department if you develop worsening pain, unable to tolerate liquids or severe bloody diarrhea.

## 2018-09-11 NOTE — ED Provider Notes (Addendum)
[facility] EMERGENCY DEPARTMENT Provider Note   CSN: [id number] Arrival date & time: 09/11/18  1353    History   Chief Complaint Chief Complaint  Patient presents with   Abdominal Pain    HPI Brad Dennis is a 48 y.o. male with past medical history who presents for evaluation of abdominal pain x2 weeks.  Patient states he has had left lower quadrant abdominal pain as well as suprapubic pain which is been constant in nature.  Patient was seen by PCP and was told to take Pepto-Bismol.  Patient has taken Pepto-Bismol the last 2 days without relief of symptoms.  Patient also complains that he has had diarrhea x3 weeks.  No recent travel.  He did have antibiotic use.  States he was on clindamycin approximately 1 month ago for a dental infection.  Has had nausea without emesis.  Patient states his stools have been dark in color over the last week.  Rates his pain a 7/10.  Pain does not radiate.  No fever, chills, chest pain, shortness of breath.  Abdominal pain does not radiate into his back.  Denies previous history of GI bleeds, kidney stones, diverticulitis or diverticulosis.  No urinary symptoms.  Denies additional aggravating or alleviating factors. Patient with 10-11 watery stool a day.  History obtained from patient.  No interpreter was used.     HPI  History reviewed. No pertinent past medical history.  There are no active problems to display for this patient.   History reviewed. No pertinent surgical history.      Home Medications    Prior to Admission medications   Medication Sig Start Date End Date Taking? Authorizing Provider  HYDROcodone-acetaminophen (NORCO/VICODIN) 5-325 MG tablet Take 1 tablet by mouth every 6 (six) hours as needed for severe pain. 09/11/18   Wiley Magan A, PA-C  metroNIDAZOLE (FLAGYL) 500 MG tablet Take 1 tablet (500 mg total) by mouth 3 (three) times daily for 10 days. 09/11/18 09/21/18  Kellyanne Ellwanger A, PA-C  naproxen  (NAPROSYN) 500 MG tablet Take 1 tablet (500 mg total) by mouth 2 (two) times daily. 11/26/16   Vanetta Mulders, MD  ondansetron (ZOFRAN) 4 MG tablet Take 1 tablet (4 mg total) by mouth every 6 (six) hours. 09/11/18   Charly Holcomb A, PA-C    Family History History reviewed. No pertinent family history.  Social History Social History   Tobacco Use   Smoking status: Current Some Day Smoker    Types: Cigars   Smokeless tobacco: Never Used  Substance Use Topics   Alcohol use: No   Drug use: No     Allergies   Patient has no known allergies.   Review of Systems Review of Systems  Constitutional: Negative.   HENT: Negative.   Eyes: Negative.   Respiratory: Negative.   Cardiovascular: Negative.   Gastrointestinal: Positive for abdominal pain, diarrhea and nausea. Negative for abdominal distention, anal bleeding, blood in stool, constipation, rectal pain and vomiting.  Genitourinary: Negative.   Musculoskeletal: Negative.   Skin: Negative.   Neurological: Negative.   All other systems reviewed and are negative.    Physical Exam Updated Vital Signs BP 138/83 (BP Location: Left Arm)    Pulse 72    Temp 98.2 F (36.8 C) (Oral)    Resp 20    Ht 6' (1.829 m)    Wt 102.1 kg    SpO2 100%    BMI 30.52 kg/m   Physical Exam Vitals signs and nursing note reviewed.  Exam conducted with a chaperone present.  Constitutional:      General: He is not in acute distress.    Appearance: He is well-developed. He is not ill-appearing, toxic-appearing or diaphoretic.  HENT:     Head: Normocephalic and atraumatic.     Mouth/Throat:     Comments: Posterior ropharynx clear.  Mucous membranes moist. Eyes:     Pupils: Pupils are equal, round, and reactive to light.  Neck:     Musculoskeletal: Normal range of motion and neck supple.  Cardiovascular:     Rate and Rhythm: Normal rate and regular rhythm.     Heart sounds: Normal heart sounds.  Pulmonary:     Effort: Pulmonary effort is  normal. No respiratory distress.     Comments: Clear to auscultation bilateral without wheeze, rhonchi rales.  No accessory muscle usage.  Able speak in full sentences without difficulty. Abdominal:     General: There is no distension.     Palpations: Abdomen is soft.     Comments: Soft without rebound or guarding.  Generalized abdominal tenderness to suprapubic and left lower quadrant.  Normoactive bowel sounds.  No CVA tenderness.  Genitourinary:    Prostate: Normal.     Rectum: Normal. No mass, tenderness, anal fissure, external hemorrhoid or internal hemorrhoid. Normal anal tone.     Comments: Rectal exam with chaperone in room.  Normal sphincter tone.  Light brown soft stool to rectal vault.  No evidence of internal/external hemorrhoids.  No Gross blood. Musculoskeletal: Normal range of motion.     Comments: Moves all 4 extremities without difficulty.  Ambulatory in department without difficulty.  Skin:    General: Skin is warm and dry.  Neurological:     Mental Status: He is alert.    ED Treatments / Results  Labs (all labs ordered are listed, but only abnormal results are displayed) Labs Reviewed  COMPREHENSIVE METABOLIC PANEL - Abnormal; Notable for the following components:      Result Value   Potassium 3.4 (*)    Glucose, Bld 105 (*)    Calcium 8.8 (*)    All other components within normal limits  CBC - Abnormal; Notable for the following components:   WBC 10.8 (*)    All other components within normal limits  URINALYSIS, ROUTINE W REFLEX MICROSCOPIC - Abnormal; Notable for the following components:   Hgb urine dipstick TRACE (*)    All other components within normal limits  URINALYSIS, MICROSCOPIC (REFLEX) - Abnormal; Notable for the following components:   Bacteria, UA RARE (*)    All other components within normal limits  OCCULT BLOOD X 1 CARD TO LAB, STOOL - Abnormal; Notable for the following components:   Fecal Occult Bld POSITIVE (*)    All other components  within normal limits  C DIFFICILE QUICK SCREEN W PCR REFLEX  LIPASE, BLOOD    EKG None  Radiology Ct Abdomen Pelvis W Contrast  Result Date: 09/11/2018 CLINICAL DATA:  Left lower quadrant pain for 2 weeks and diarrhea beginning after receiving antibiotics for a root canal. EXAM: CT ABDOMEN AND PELVIS WITH CONTRAST TECHNIQUE: Multidetector CT imaging of the abdomen and pelvis was performed using the standard protocol following bolus administration of intravenous contrast. CONTRAST:  OMNIPAQUE IOHEXOL 300 MG/ML  SOLN COMPARISON:  03/05/2016 FINDINGS: Lower chest: Clear lung bases. Hepatobiliary: No focal liver abnormality is seen. No gallstones, gallbladder wall thickening, or biliary dilatation. Pancreas: Unremarkable. Spleen: Unremarkable. Adrenals/Urinary Tract: Unremarkable adrenal glands. No evidence of  renal mass, calculi, or hydronephrosis. Unremarkable bladder. Stomach/Bowel: The stomach is largely collapsed. There is intestinal malrotation with the small bowel being located in the right abdomen and the colon in the left abdomen. The cecum is located centrally in the lower pelvis. There is prominent circumferential colonic wall thickening extending continuously from the cecum to the splenic flexure with mild surrounding inflammatory stranding. A small amount of stool is present in the more distal colon. There is no small bowel dilatation. The appendix is not clearly identified. Vascular/Lymphatic: No significant vascular findings are present. No enlarged abdominal or pelvic lymph nodes. Reproductive: Unremarkable prostate. Prior vasectomy. Other: No ascites or pneumoperitoneum. Musculoskeletal: No acute osseous abnormality or suspicious osseous lesion. IMPRESSION: 1. Diffuse proximal colonic wall thickening extending to the splenic flexure consistent with colitis, likely infectious (including possible C. difficile). No perforation or abscess. 2. Congenital intestinal malrotation.  Electronically Signed   By: Sebastian Ache M.D.   On: 09/11/2018 16:04    Procedures Procedures (including critical care time)  Medications Ordered in ED Medications  morphine 4 MG/ML injection 4 mg (4 mg Intravenous Not Given 09/11/18 1505)  sodium chloride 0.9 % bolus 1,000 mL ( Intravenous Stopped 09/11/18 1627)  ondansetron (ZOFRAN) injection 4 mg (4 mg Intravenous Given 09/11/18 1510)  pantoprazole (PROTONIX) injection 40 mg (40 mg Intravenous Given 09/11/18 1606)  iohexol (OMNIPAQUE) 300 MG/ML solution 100 mL (100 mLs Intravenous Contrast Given 09/11/18 1542)   Initial Impression / Assessment and Plan / ED Course  I have reviewed the triage vital signs and the nursing notes.  Pertinent labs & imaging results that were available during my care of the patient were reviewed by me and considered in my medical decision making (see chart for details).  48 year old male appears otherwise well presents for evaluation of abdominal pain diarrhea.  Onset 2-3 weeks ago.  No recent travel, however did recently complete course of clindamycin for dental infection approximately 1 month ago.  Abdomen tender to suprapubic and left lower quadrant.  Denies history of diverticulitis or diverticulosis.  States stools have been "dark" in color.  GU exam with light brown stool in vault.  No gross blood on exam.  Normal sphincter tone.  No evidence of internal or external hemorrhoids.  Mucous membranes moist.  Will obtain labs, imaging, fluids antiemetics and pain control and reevaluate.  Concern for C. difficile given recent clindamycin use.  Will try to obtain stool sample while in department.  1530: CBC with mild leukocytosis at 10.8, lipase 28, mild hypokalemia at 3.4, no additional electrolyte, renal or liver abnormality, urinalysis negative, fecal occult positive.  Patient unable to supply stool sample at this time.  CT scan with evidence of colitis, presumably infectious in nature.  Will p.o. trial and  reevaluate.  1645: Patient is nontoxic, nonseptic appearing, in no apparent distress. On repeat exam patient does not have a surgical abdomin and there are no peritoneal signs.  No indication of appendicitis, bowel obstruction, bowel perforation, cholecystitis, diverticulitis.  1730: Patient able to tolerate p.o. intake in department that difficulty.  Stool sample provided.  C. difficile screen sent.  This will take approximately 24 hours to return.  Given recent clindamycin use, CT findings as well as 10-11 watery stools per day will treat as presumed C. difficile colitis.  Discussed cleaning precautions at home.  Patient without significant abdominal pain in department.  Reevaluation abdomen soft without rebound or guarding.  Patient able to ambulate without difficulty.  Nonsurgical abdomen.  Patient does  not meet Sirs or sepsis criteria.  We will send patient home with oral vancomycin, pain medication as well as Zofran.  Patient to follow-up with PCP on Monday for reevaluation.  Discussed strict return precautions.  Patient voiced understanding is agreeable for follow-up.  Hemodynamically stable and appropriate for DC home at this time.  I have discussed patient with my attending, Dr. Fredderick Phenix who agrees with the plan plan and disposition.  1758: ADDENDUM: Pharmacy called and patient is unable to afford PO Vancomycin. Discussed this is the ideal treatment however if he cannot afford this will send script for Flagyl which is an alternative regime on [employer].     Final Clinical Impressions(s) / ED Diagnoses   Final diagnoses:  Colitis  Infectious diarrhea    ED Discharge Orders         Ordered    vancomycin (VANCOCIN HCL) 125 MG capsule  4 times daily,   Status:  Discontinued     09/11/18 1736    HYDROcodone-acetaminophen (NORCO/VICODIN) 5-325 MG tablet  Every 6 hours PRN     09/11/18 1736    ondansetron (ZOFRAN) 4 MG tablet  Every 6 hours     09/11/18 1736    metroNIDAZOLE (FLAGYL) 500 MG  tablet  3 times daily     09/11/18 1759           Gracemarie Skeet A, PA-C 09/11/18 1750    Cleon Thoma A, PA-C 09/11/18 1801    Gwyneth Sprout, MD 09/11/18 2057

## 2018-09-11 NOTE — ED Notes (Signed)
Po challenge and crackers given per provider. Pt unable to give stool sample at this time.

## 2018-09-11 NOTE — ED Triage Notes (Signed)
Reports lower left abdominal pain x 2 weeks.  C/o diarrhea.  Denies N/V. Recently finished abx for dental issue.  States he was taking clindamycin and began having diarrhea after this.

## 2019-04-01 ENCOUNTER — Other Ambulatory Visit: Payer: Self-pay

## 2019-04-01 ENCOUNTER — Encounter: Payer: Self-pay | Admitting: Emergency Medicine

## 2019-04-01 ENCOUNTER — Emergency Department (INDEPENDENT_AMBULATORY_CARE_PROVIDER_SITE_OTHER)
Admission: EM | Admit: 2019-04-01 | Discharge: 2019-04-01 | Disposition: A | Discharge status: Home / Self Care | Payer: Worker's Compensation | Source: Home / Self Care | Attending: Family Medicine | Admitting: Family Medicine

## 2019-04-01 DIAGNOSIS — S76212A Strain of adductor muscle, fascia and tendon of left thigh, initial encounter: Secondary | ICD-10-CM | POA: Diagnosis not present

## 2019-04-01 DIAGNOSIS — S76012A Strain of muscle, fascia and tendon of left hip, initial encounter: Secondary | ICD-10-CM

## 2019-04-01 NOTE — ED Triage Notes (Signed)
Left inner thigh injury pulling up on the back of a trailer.

## 2019-04-01 NOTE — Discharge Instructions (Addendum)
Apply ice pack for 20 to 30 minutes, 3 to 4 times daily  Continue until pain and swelling decrease.  Begin Ibuprofen 200mg, 4 tabs every 8 hours with food.  Begin range of motion and stretching exercises as tolerated. °

## 2019-04-01 NOTE — ED Provider Notes (Signed)
[facility]    CSN: [id number] Arrival date & time: 04/01/19  1449      History   Chief Complaint Chief Complaint  Patient presents with  . Leg Injury    HPI Brad Dennis is a 48 y.o. male.   Patient presents for occupational injury.  While climbing into his truck this morning he felt a pulling/burning sensation in his left anterior thigh.  The pain has persisted, worse when flexing his left hip.  The history is provided by the patient.  Leg Pain Location:  Leg Time since incident:  5 hours Injury: yes   Leg location:  L upper leg Pain details:    Quality:  Aching and burning   Radiates to:  Does not radiate   Onset quality:  Sudden   Duration:  5 hours   Timing:  Constant   Progression:  Unchanged Chronicity:  New Prior injury to area:  No Relieved by:  None tried Worsened by:  Flexion Ineffective treatments:  None tried Associated symptoms: no decreased ROM, no muscle weakness, no numbness, no swelling and no tingling     History reviewed. No pertinent past medical history.  There are no active problems to display for this patient.   History reviewed. No pertinent surgical history.     Home Medications    Prior to Admission medications   Medication Sig Start Date End Date Taking? Authorizing Provider  naproxen (NAPROSYN) 500 MG tablet Take 1 tablet (500 mg total) by mouth 2 (two) times daily. 11/26/16   Vanetta Mulders, MD    Family History Family History  Problem Relation Age of Onset  . Healthy Mother   . Stroke Father     Social History Social History   Tobacco Use  . Smoking status: Former Smoker    Types: Cigars  . Smokeless tobacco: Never Used  Substance Use Topics  . Alcohol use: No  . Drug use: No     Allergies   Patient has no known allergies.   Review of Systems Review of Systems  All other systems reviewed and are negative.    Physical Exam Triage Vital Signs ED Triage Vitals  Enc Vitals Group     BP 04/01/19 1544 133/88     Pulse Rate 04/01/19 1544 65     Resp --      Temp 04/01/19 1544 98.5 F (36.9 C)     Temp Source 04/01/19 1544 Oral     SpO2 04/01/19 1544 98 %     Weight 04/01/19 1545 223 lb (101.2 kg)     Height 04/01/19 1545 6' (1.829 m)     Head Circumference --      Peak Flow --      Pain Score 04/01/19 1545 7     Pain Loc --      Pain Edu? --      Excl. in GC? --    No data found.  Updated Vital Signs BP 133/88 (BP Location: Right Arm)   Pulse 65   Temp 98.5 F (36.9 C) (Oral)   Ht 6' (1.829 m)   Wt 101.2 kg   SpO2 98%   BMI 30.24 kg/m   Visual Acuity Right Eye Distance:   Left Eye Distance:   Bilateral Distance:    Right Eye Near:   Left Eye Near:    Bilateral Near:     Physical Exam Constitutional:      General: He is not in acute distress. HENT:  Head: Normocephalic.     Right Ear: External ear normal.     Left Ear: External ear normal.     Nose: Nose normal.     Mouth/Throat:     Pharynx: Oropharynx is clear.  Eyes:     Pupils: Pupils are equal, round, and reactive to light.  Neck:     Musculoskeletal: Normal range of motion.  Cardiovascular:     Rate and Rhythm: Normal rate.  Pulmonary:     Effort: Pulmonary effort is normal.  Abdominal:     Tenderness: There is no abdominal tenderness.  Musculoskeletal:     Left hip: He exhibits tenderness. He exhibits normal range of motion, normal strength, no bony tenderness and no swelling.     Left upper leg: He exhibits tenderness. He exhibits no bony tenderness, no swelling, no edema, no deformity and no laceration.     Right lower leg: No edema.     Left lower leg: No edema.       Legs:     Comments: There is tenderness to palpation over the left anterior/medial thigh as noted on diagram.  No swelling is present.  Pain is elicited by resisted flexion and adduction of the left hip.  Skin:    General: Skin is warm and dry.     Findings: No rash.  Neurological:     Mental Status:  He is alert.      UC Treatments / Results  Labs (all labs ordered are listed, but only abnormal results are displayed) Labs Reviewed - No data to display  EKG   Radiology No results found.  Procedures Procedures (including critical care time)  Medications Ordered in UC Medications - No data to display  Initial Impression / Assessment and Plan / UC Course  I have reviewed the triage vital signs and the nursing notes.  Pertinent labs & imaging results that were available during my care of the patient were reviewed by me and considered in my medical decision making (see chart for details).    Recommend follow-up with [facility] in one week.   Final Clinical Impressions(s) / UC Diagnoses   Final diagnoses:  Strain of left groin  Strain of flexor muscle of left hip, initial encounter     Discharge Instructions     Apply ice pack for 20 to 30 minutes, 3 to 4 times daily  Continue until pain and swelling decrease.  Begin Ibuprofen 200mg , 4 tabs every 8 hours with food.  Begin range of motion and stretching exercises as tolerated.   ED Prescriptions    None        Kandra Nicolas, MD 04/02/19 1440

## 2019-04-09 ENCOUNTER — Other Ambulatory Visit: Payer: Self-pay | Admitting: Gerontology

## 2019-04-09 DIAGNOSIS — S76912A Strain of unspecified muscles, fascia and tendons at thigh level, left thigh, initial encounter: Secondary | ICD-10-CM

## 2019-04-11 ENCOUNTER — Ambulatory Visit (INDEPENDENT_AMBULATORY_CARE_PROVIDER_SITE_OTHER): Payer: Worker's Compensation

## 2019-04-11 ENCOUNTER — Other Ambulatory Visit: Payer: Self-pay

## 2019-04-11 DIAGNOSIS — S76912A Strain of unspecified muscles, fascia and tendons at thigh level, left thigh, initial encounter: Secondary | ICD-10-CM

## 2019-04-11 IMAGING — MR MR FEMUR*L* W/O CM
4 of 5 series · 25 of 40 positions shown · non-contrast
Comparison: None.

CLINICAL DATA: Onset medial left thigh pain from the groin to the
knee after climbing into a trailer 10 days ago. Difficulty walking.
Initial encounter.

EXAM:
MR OF THE LEFT FEMUR WITHOUT CONTRAST
TECHNIQUE: Multiplanar, multisequence MR imaging of the left femur. Was
performed. No intravenous contrast was administered.

[Series 9: T1 · coronal · non-contrast · 6.5mm · 0.74mm/px · 6 of 36 slices shown (1 of 2)]
[im 1/36]
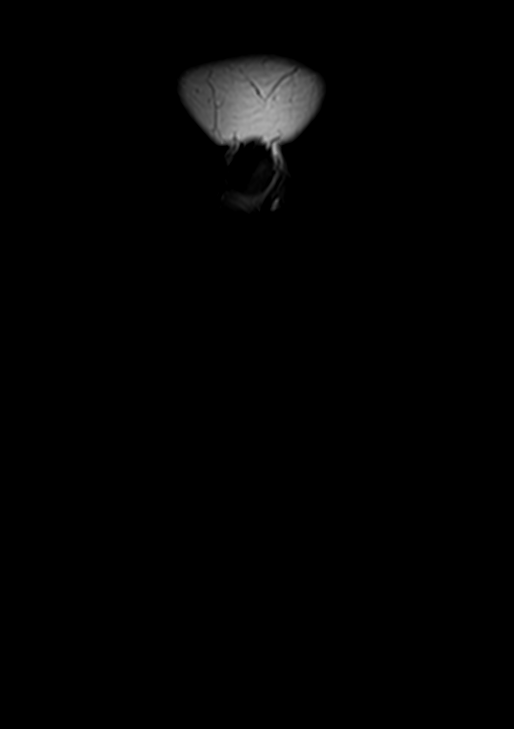
[im 8/36]
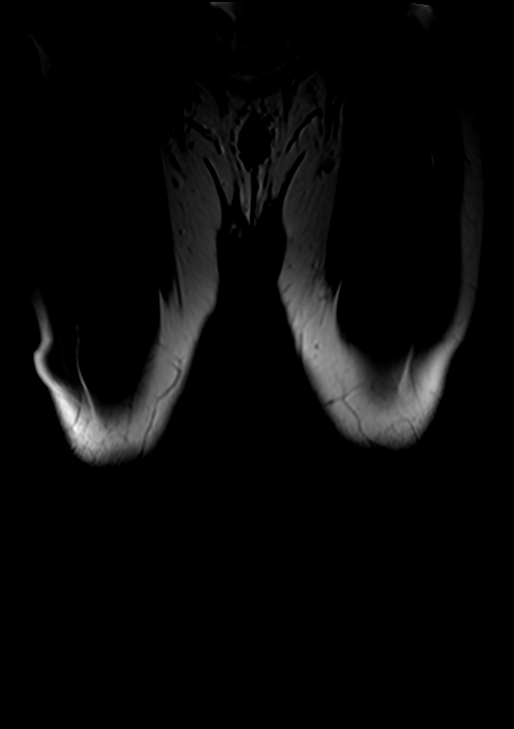
[im 15/36]
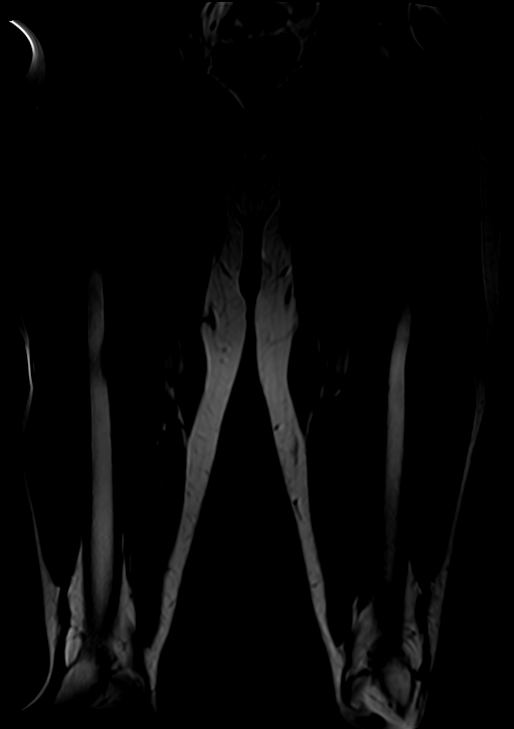
[im 22/36]
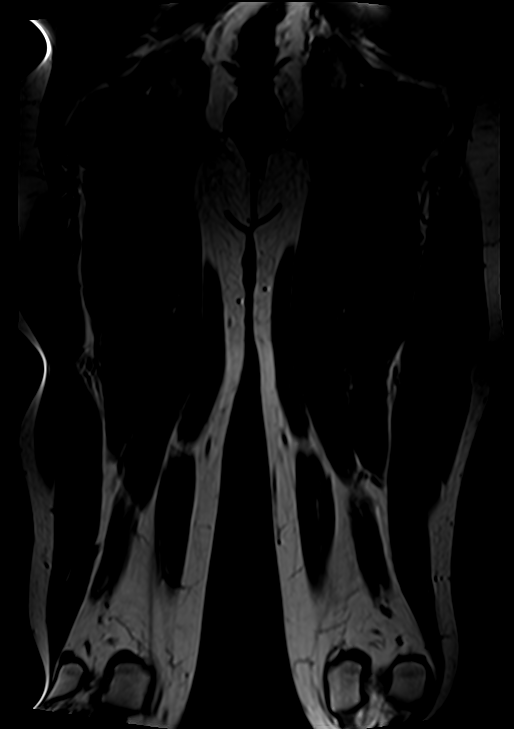
[im 29/36]
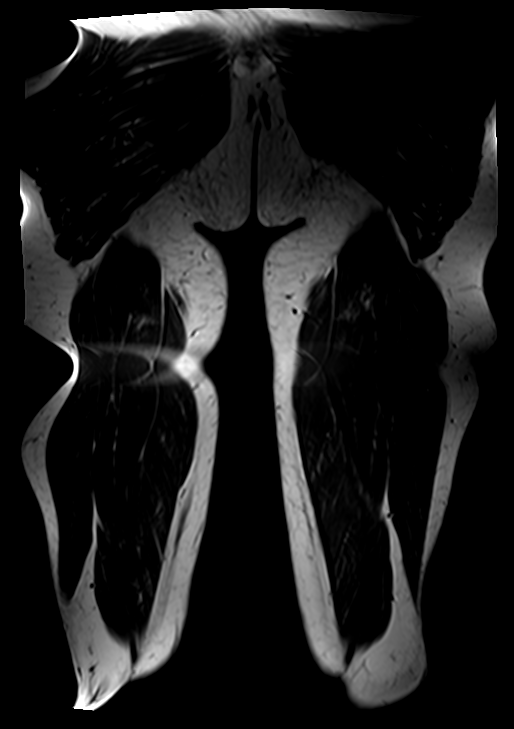
[im 36/36]
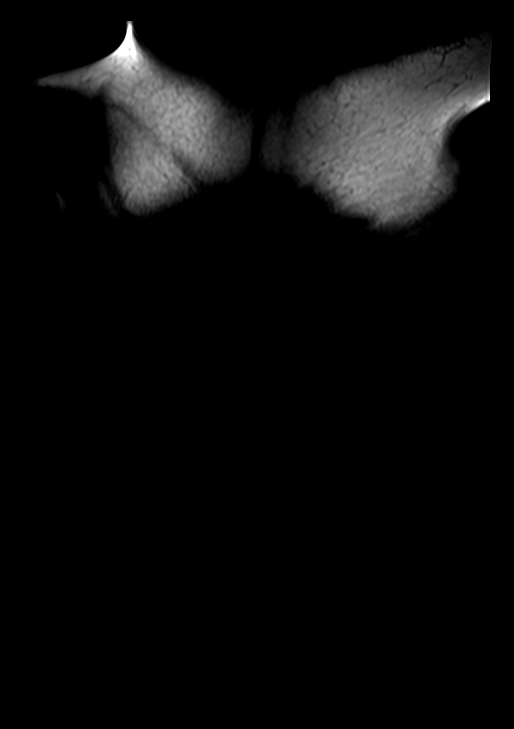

[Series 12: STIR · coronal · 6.5mm · 0.99mm/px · 5 of 31 slices shown (1 of 2)]
[im 1/31]
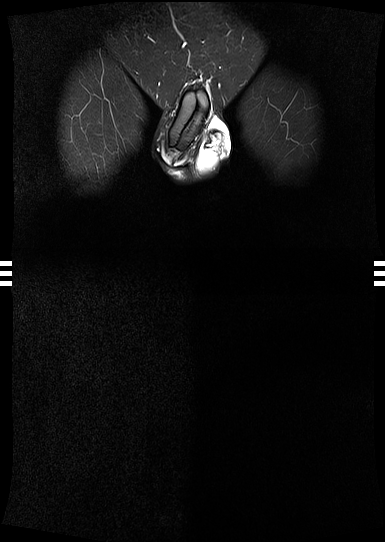
[im 7/31]
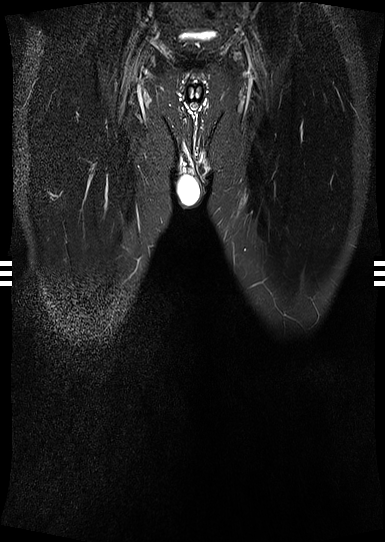
[im 13/31]
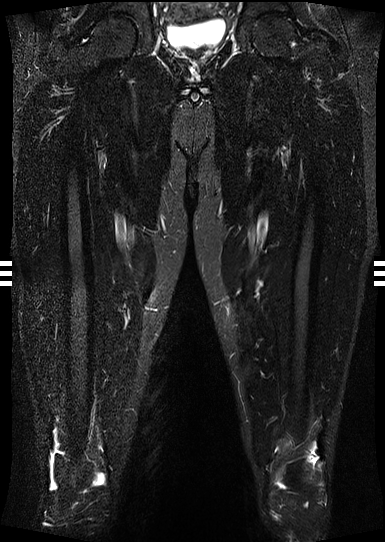
[im 19/31]
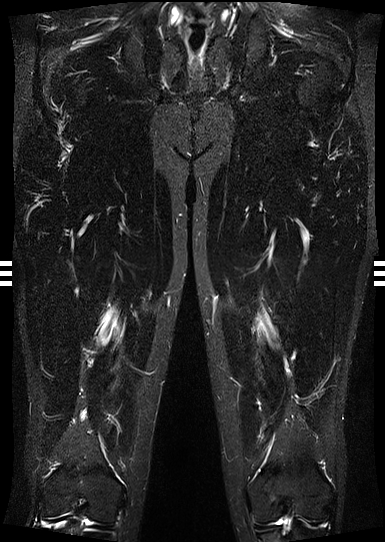
[im 31/31]
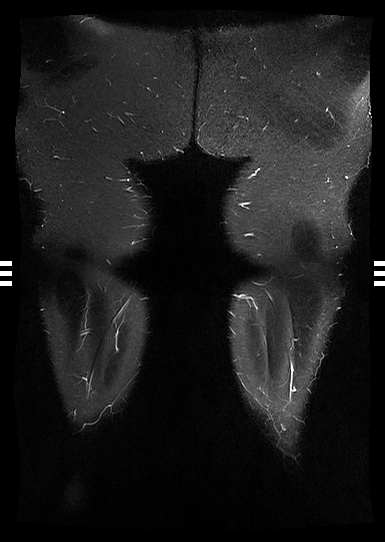

[Series 15: T1 · axial · 7.5mm · 0.65mm/px · z∈[-281,+255]mm · 11 of 56 slices shown (2 of 2)]
[im 1/56]
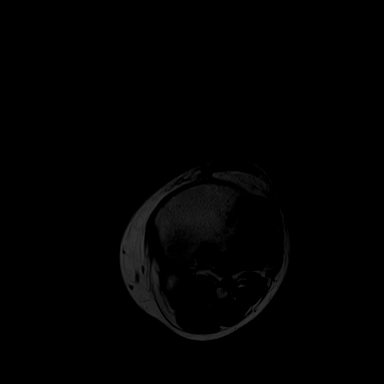
[im 6/56]
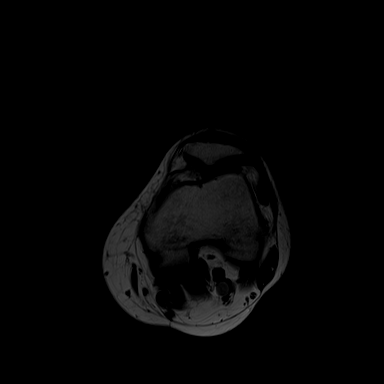
[im 12/56]
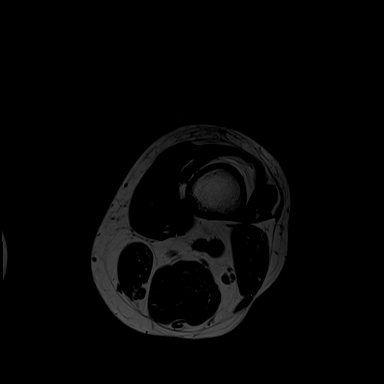
[im 17/56]
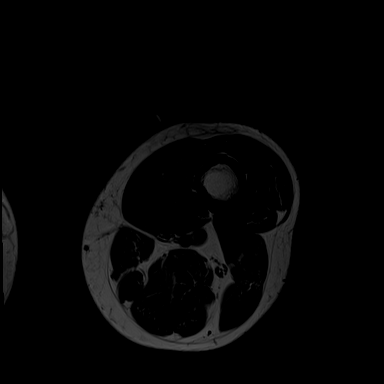
[im 23/56]
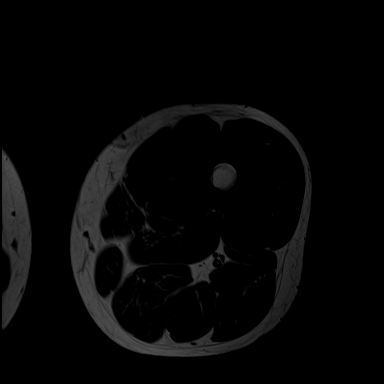
[im 28/56]
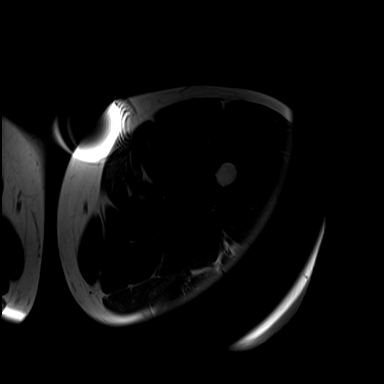
[im 34/56]
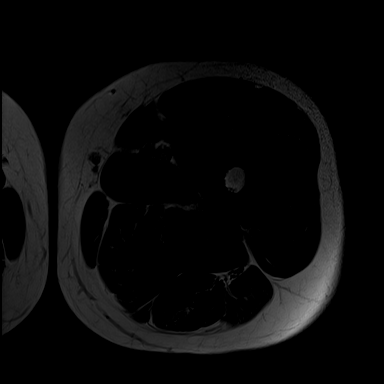
[im 39/56]
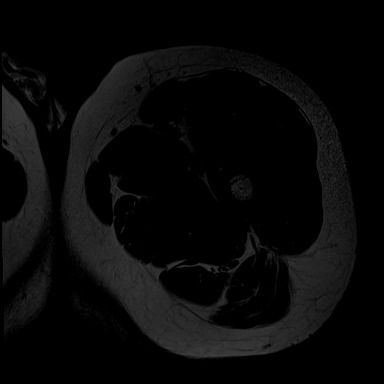
[im 45/56]
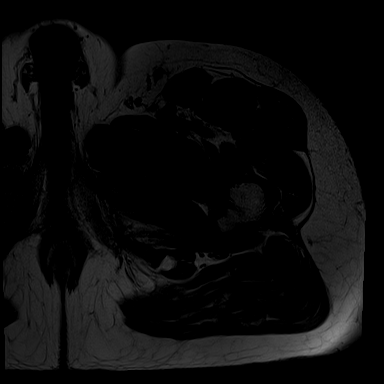
[im 50/56]
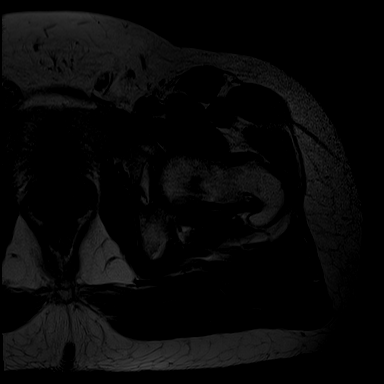
[im 56/56]
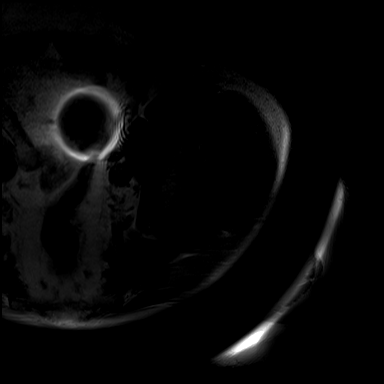

[Series 18: STIR · axial · 7.5mm · 0.78mm/px · z∈[-232,+197]mm · 3 of 56 slices shown (2 of 2)]
[im 6/56]
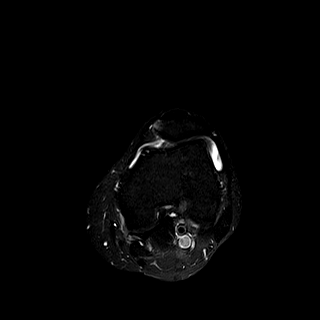
[im 28/56]
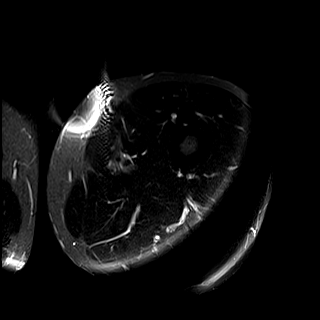
[im 50/56]
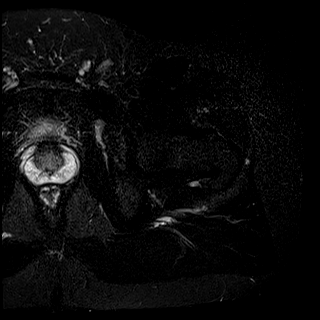

[25 of 40 positions shown; findings below may reference images not displayed]

FINDINGS: Bones/Joint/Cartilage

Tiny synovial herniation pit at the head neck junction of the left
hip is identified. Marrow signal is otherwise normal throughout
without fracture, stress change or worrisome lesion.

Ligaments

Negative.

Muscles and Tendons

Intact and normal in appearance without strain, tear, inflammatory
change or atrophy. No intramuscular mass or fluid collection.

Soft tissues

Normal throughout.
IMPRESSION: Normal examination.  No finding to explain the patient's symptoms.

## 2020-02-03 ENCOUNTER — Other Ambulatory Visit: Payer: Self-pay | Admitting: Orthopedic Surgery

## 2020-02-03 DIAGNOSIS — M5416 Radiculopathy, lumbar region: Secondary | ICD-10-CM

## 2020-02-07 ENCOUNTER — Ambulatory Visit (INDEPENDENT_AMBULATORY_CARE_PROVIDER_SITE_OTHER): Payer: Worker's Compensation

## 2020-02-07 ENCOUNTER — Other Ambulatory Visit: Payer: Self-pay

## 2020-02-07 DIAGNOSIS — M5416 Radiculopathy, lumbar region: Secondary | ICD-10-CM

## 2020-02-07 IMAGING — MR MR LUMBAR SPINE W/O CM
4 of 5 series · 27 of 48 positions shown · non-contrast
Comparison: CT abdomen and pelvis [DATE].

CLINICAL DATA: The patient felt a pop in his low back with acute
onset of pain radiating into the left buttock and posterior upper
leg on [DATE] due to a pulling injury.

EXAM:
MRI LUMBAR SPINE WITHOUT CONTRAST
TECHNIQUE: Multiplanar, multisequence MR imaging of the lumbar spine was
performed. No intravenous contrast was administered.

[Series 2: T2 · sagittal · 4.0mm · 0.81mm/px · 5 of 15 slices shown (1 of 2)]
[im 1/15]
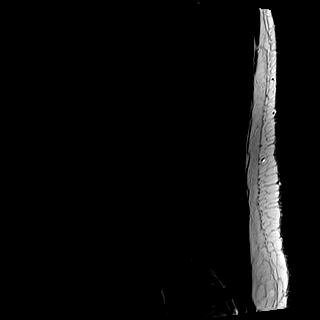
[im 4/15]
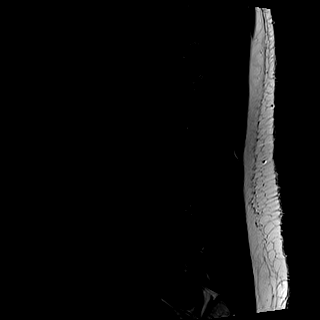
[im 8/15]
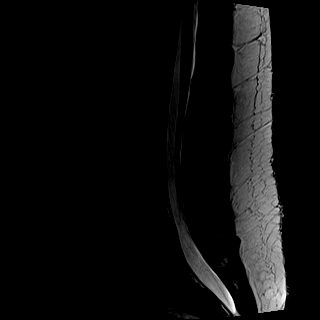
[im 11/15]
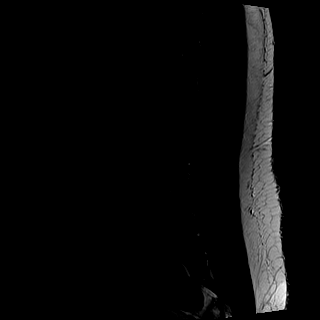
[im 15/15]
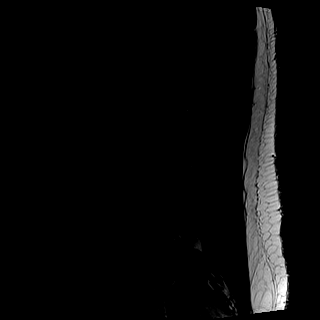

[Series 3: T1 · sagittal · 4.0mm · 0.41mm/px · 5 of 15 slices shown (1 of 2)]
[im 1/15]
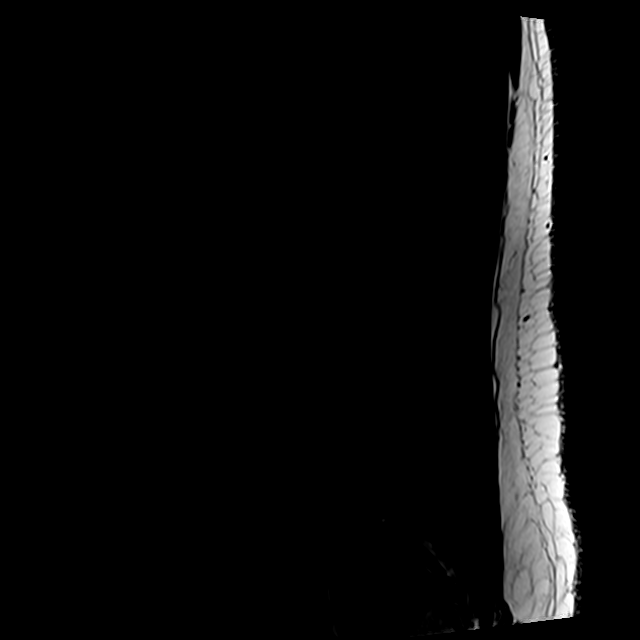
[im 4/15]
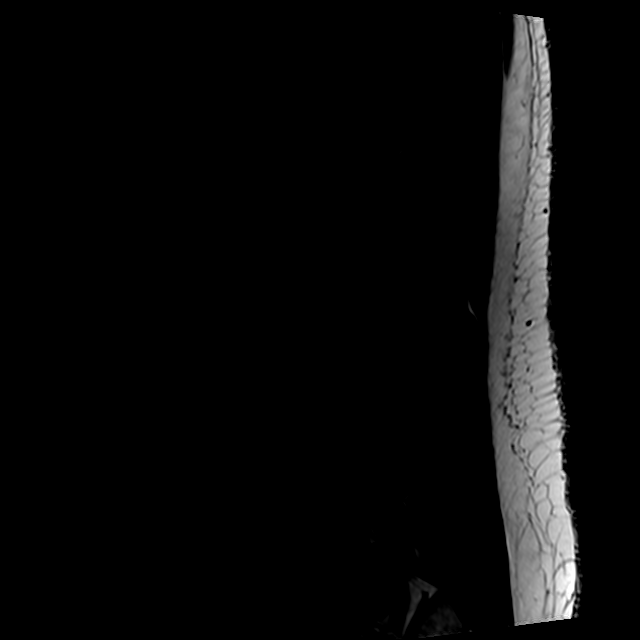
[im 8/15]
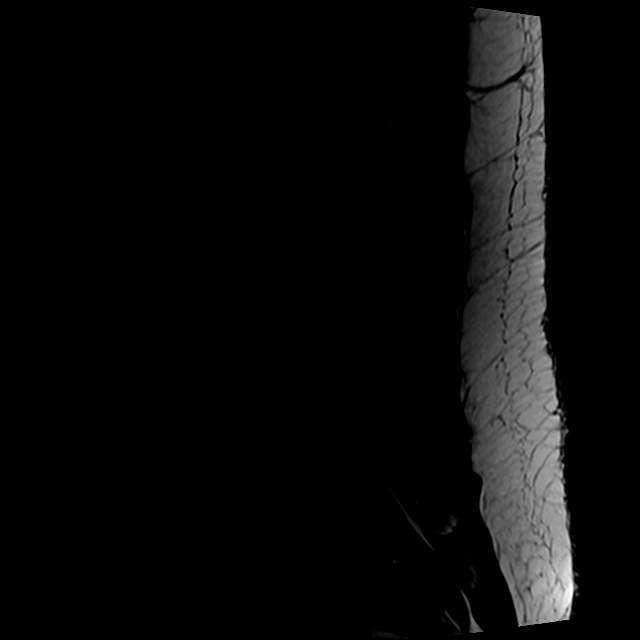
[im 11/15]
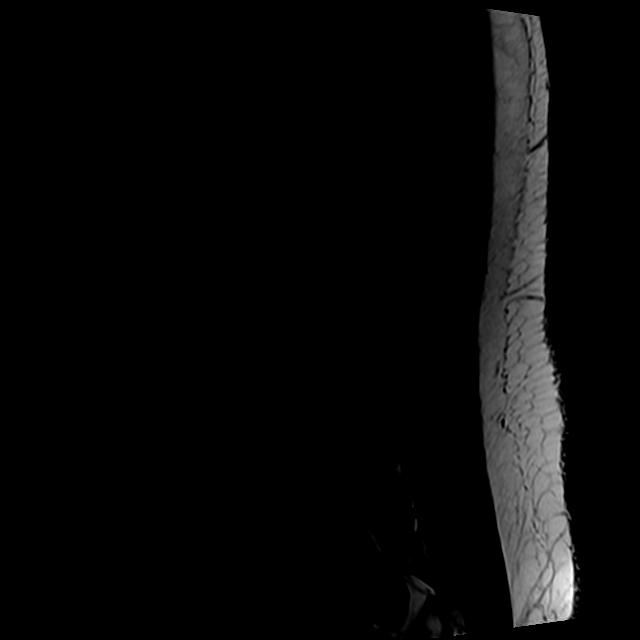
[im 15/15]
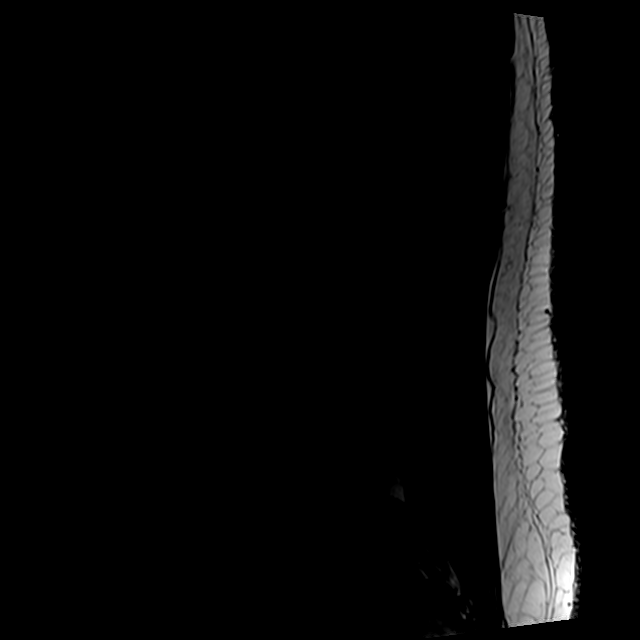

[Series 5: T2 · axial · 4.0mm · 0.78mm/px · z∈[-101,+122]mm · 10 of 42 slices shown (2 of 2)]
[im 3/42]
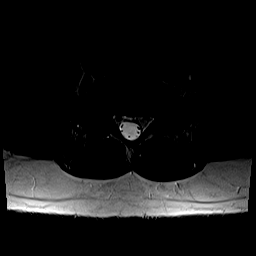
[im 6/42]
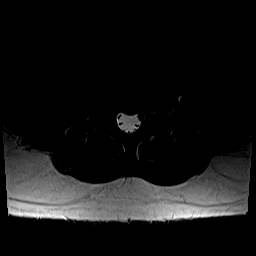
[im 9/42]
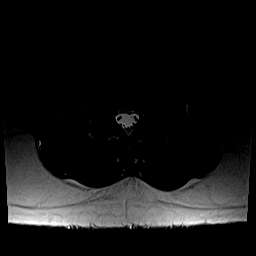
[im 14/42]
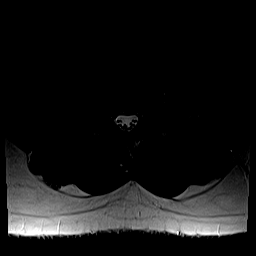
[im 20/42]
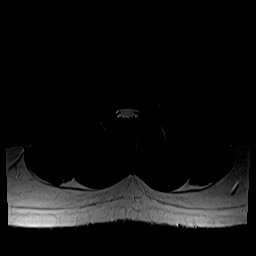
[im 22/42]
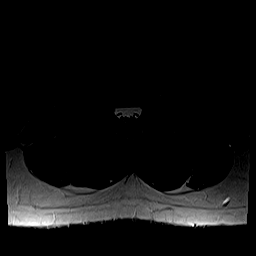
[im 25/42]
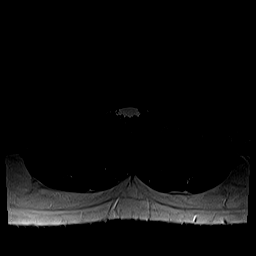
[im 31/42]
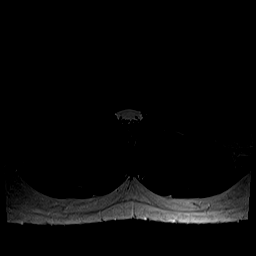
[im 36/42]
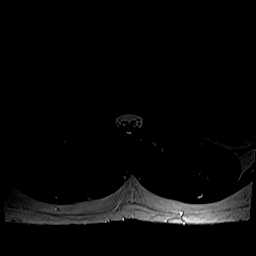
[im 42/42]
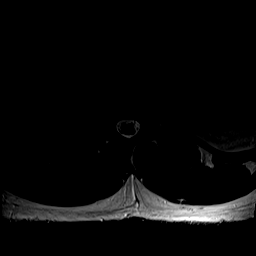

[Series 6: T1 · axial · 4.0mm · 0.39mm/px · z∈[-101,+92]mm · 7 of 42 slices shown (2 of 2)]
[im 3/42]
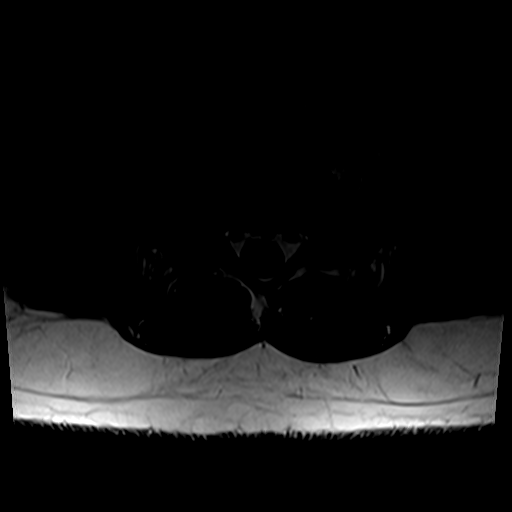
[im 6/42]
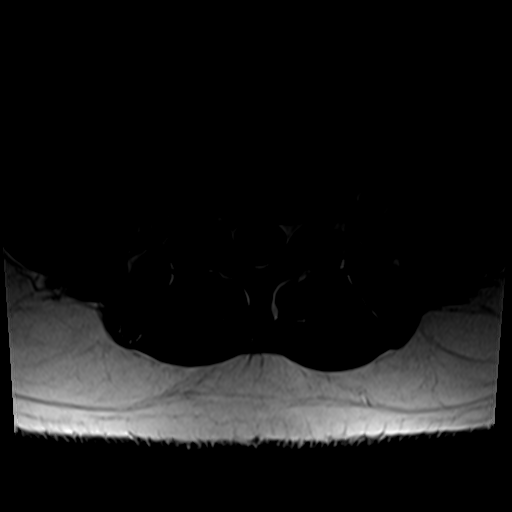
[im 9/42]
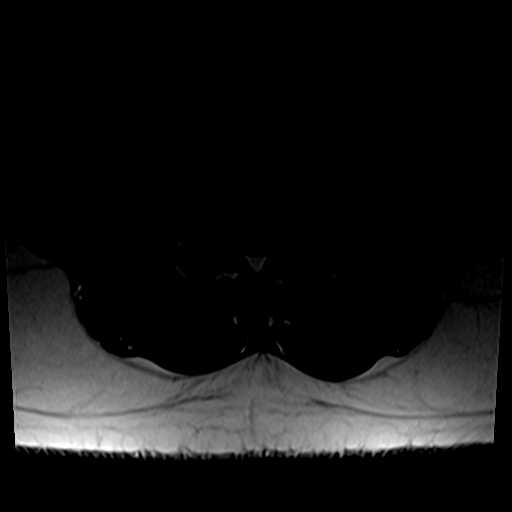
[im 14/42]
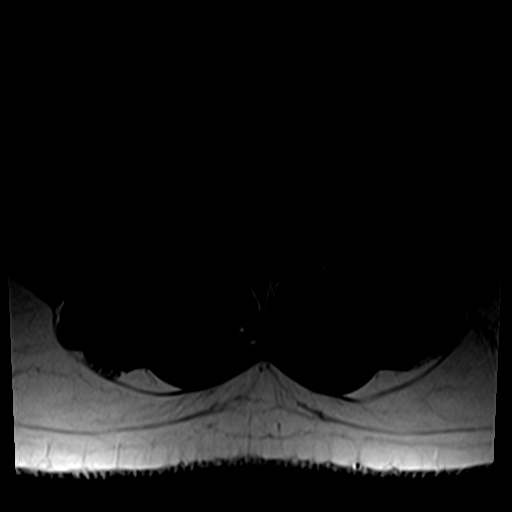
[im 20/42]
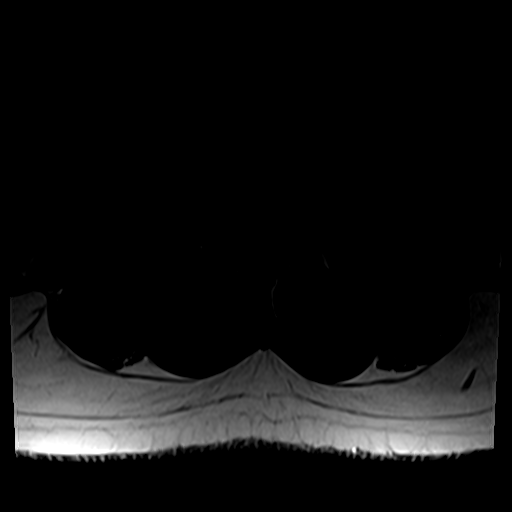
[im 22/42]
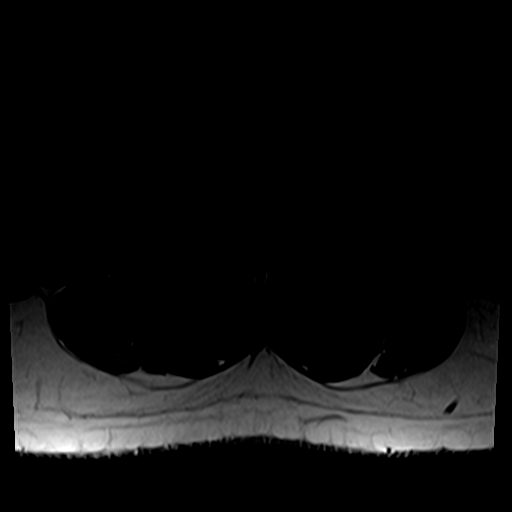
[im 36/42]
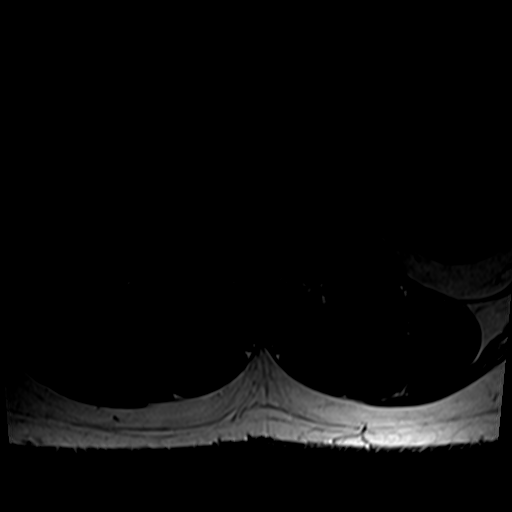

[27 of 48 positions shown; findings below may reference images not displayed]

FINDINGS: Segmentation:  Standard.

Alignment:  Normal.

Vertebrae:  No fracture, evidence of discitis, or bone lesion.

Conus medullaris and cauda equina: Conus extends to the L1-2 level.
Conus and cauda equina appear normal.

Paraspinal and other soft tissues: Negative.

Disc levels:

T11-12 is imaged in the sagittal plane only and negative.

T12-L1: Negative.

L1-2: Negative.

L2-3: Negative.

L3-4: Negative.

L4-5: Bilateral facet arthropathy is worse on the left where there
is some marrow edema in the facets and pedicles. Shallow broad-based
disc bulge is identified. There is mild narrowing in the left
lateral recess. Mild bilateral foraminal narrowing is worse on the
left.

L5-S1: Mild bilateral facet degenerative disease. Otherwise
negative.
IMPRESSION: Left worse than right facet arthropathy at L4-5 with marrow edema in
the left facets and pedicles. Mild narrowing in the left lateral
recess and both foramina is also identified at this level.

## 2020-08-15 ENCOUNTER — Other Ambulatory Visit: Payer: Self-pay | Admitting: Orthopedic Surgery

## 2020-08-15 DIAGNOSIS — M545 Low back pain, unspecified: Secondary | ICD-10-CM

## 2020-08-24 ENCOUNTER — Other Ambulatory Visit: Payer: Self-pay | Admitting: Orthopedic Surgery

## 2020-08-24 DIAGNOSIS — M545 Low back pain, unspecified: Secondary | ICD-10-CM

## 2020-09-03 ENCOUNTER — Ambulatory Visit (INDEPENDENT_AMBULATORY_CARE_PROVIDER_SITE_OTHER): Payer: Worker's Compensation

## 2020-09-03 ENCOUNTER — Other Ambulatory Visit: Payer: Self-pay

## 2020-09-03 DIAGNOSIS — M545 Low back pain, unspecified: Secondary | ICD-10-CM

## 2020-09-03 IMAGING — CT CT L SPINE W/O CM
3 of 5 series · 12 of 33 positions shown, 13 images · non-contrast
Comparison: MRI of the lumbar spine [DATE]

CLINICAL DATA: Low back pain after injury. Pain radiates into the
left gluteal muscles. Patient does not have relief with epidural
injections.

EXAM:
CT LUMBAR SPINE WITHOUT CONTRAST
TECHNIQUE: Multidetector CT imaging of the lumbar spine was performed without
intravenous contrast administration. Multiplanar CT image
reconstructions were also generated.

[Series 8: sagittal bone · sagittal · 0.38mm/px · 5 of 90 slices shown]
[im 15/90  bone]
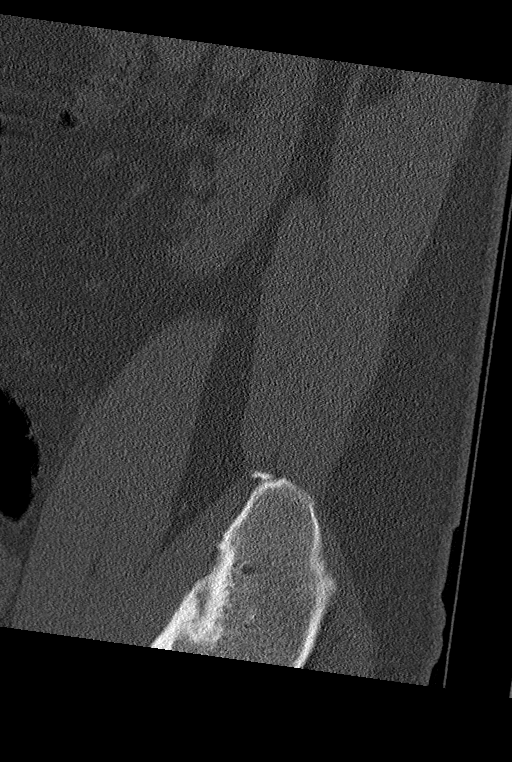
[im 30/90  bone]
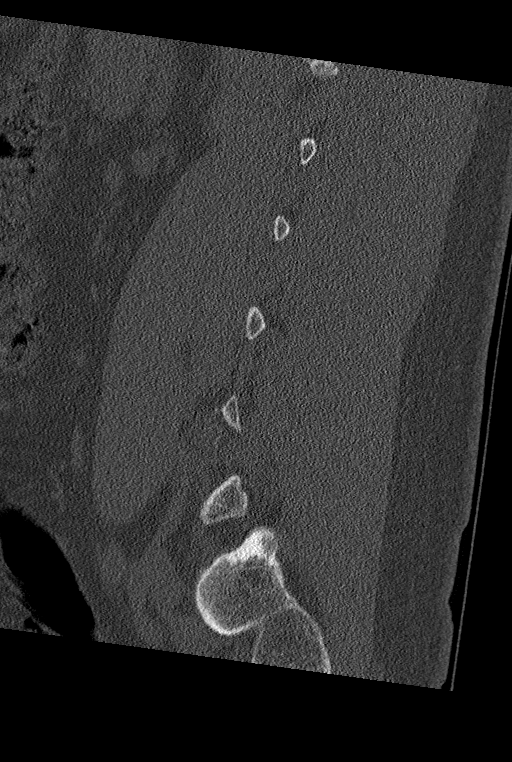
[im 45/90  bone]
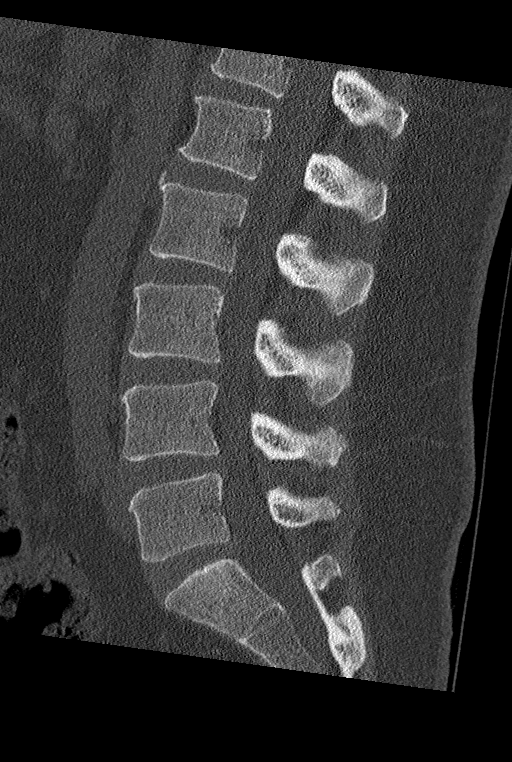
[im 60/90  bone]
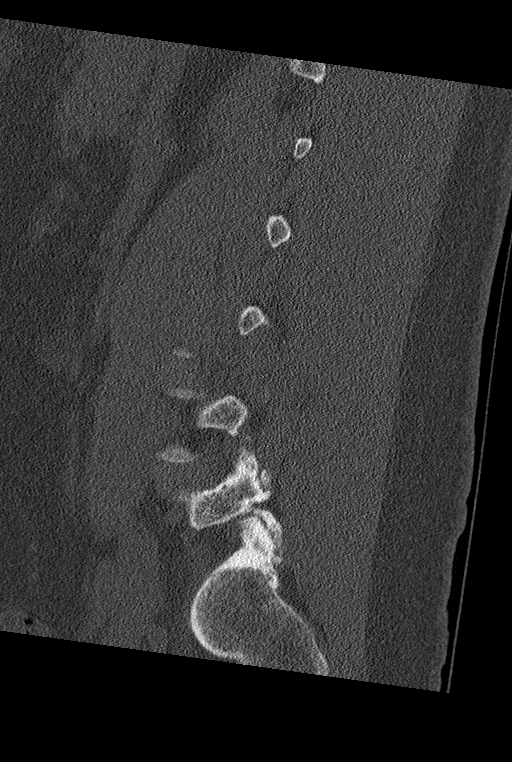
[im 75/90  bone]
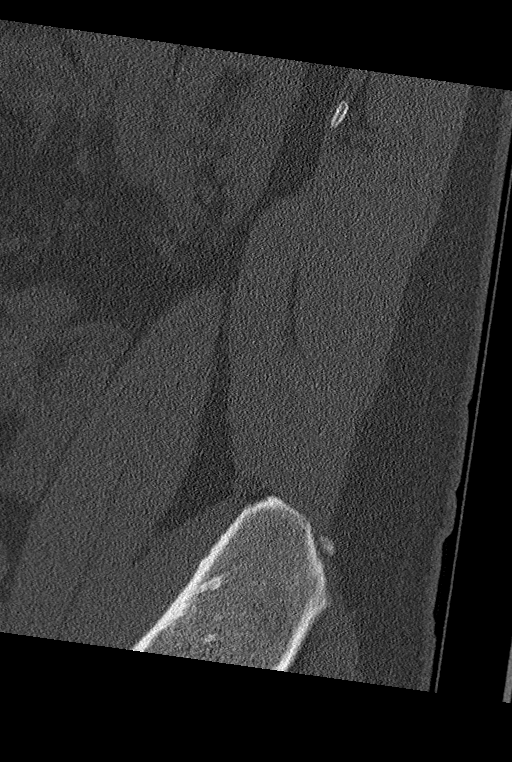

[Series 9: coronal bone · coronal · 0.35mm/px · 3 of 99 slices shown]
[im 20/99  bone]
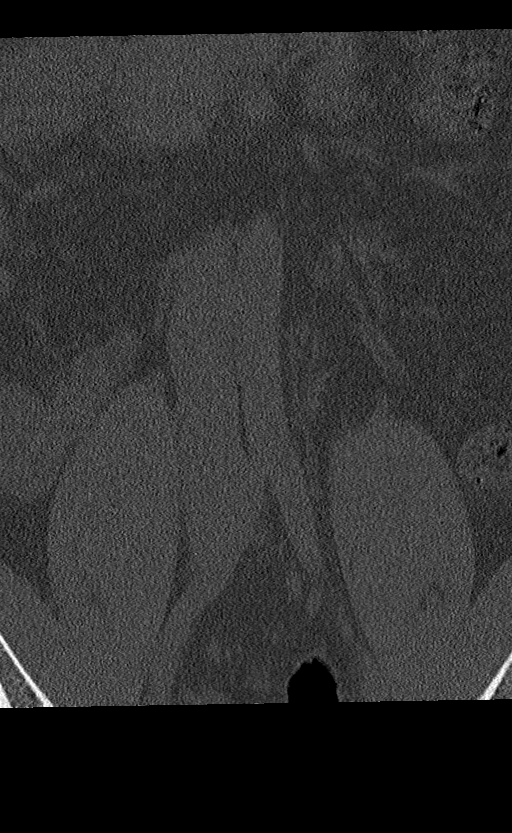
[im 40/99  bone]
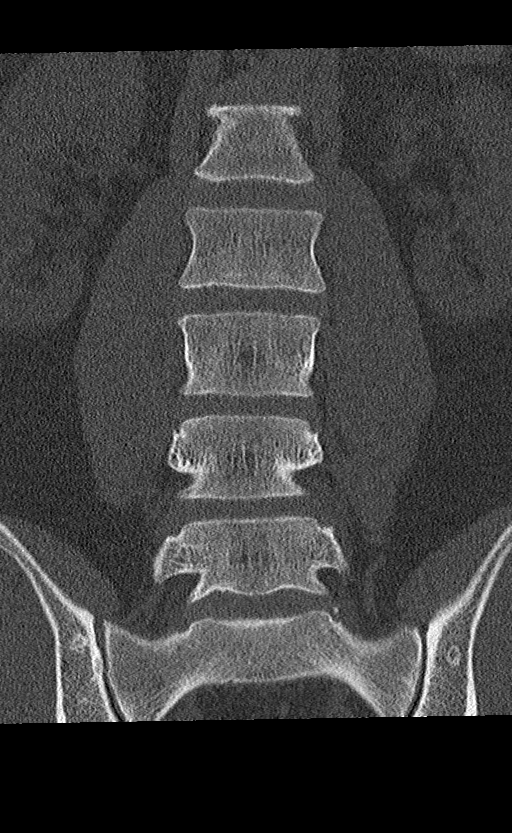
[im 59/99  bone]
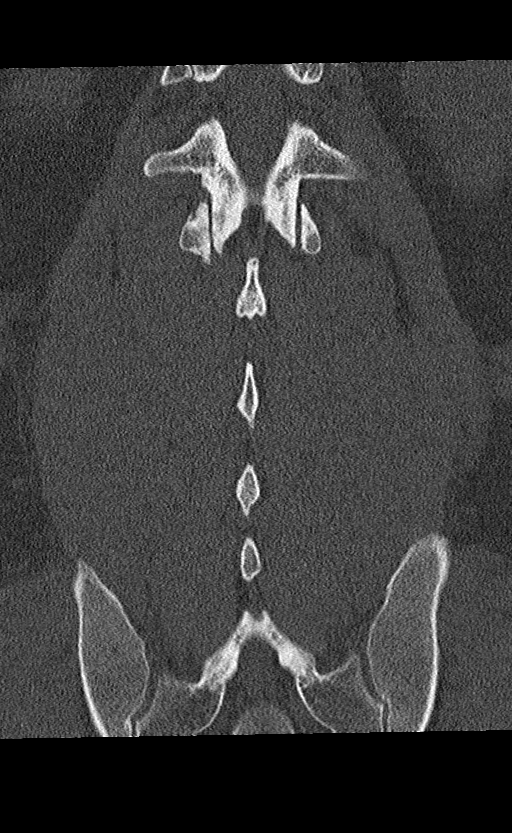

[Series 10: orthogonal axial st · axial · 0.35mm/px · z∈[-394,-219]mm · 4 of 134 slices shown, 5 images]
[im 23/134  soft-tissue]
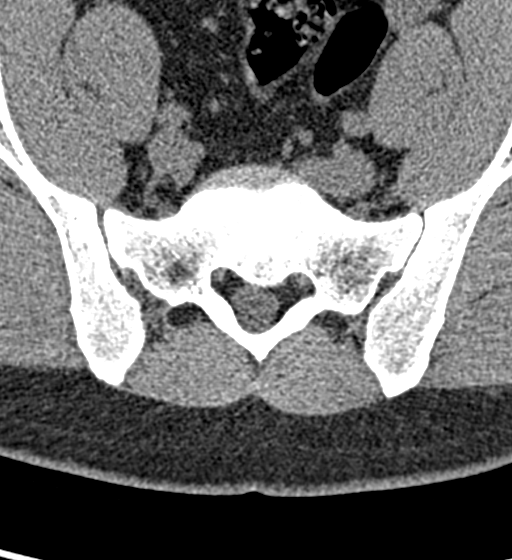
[im 23/134  bone]
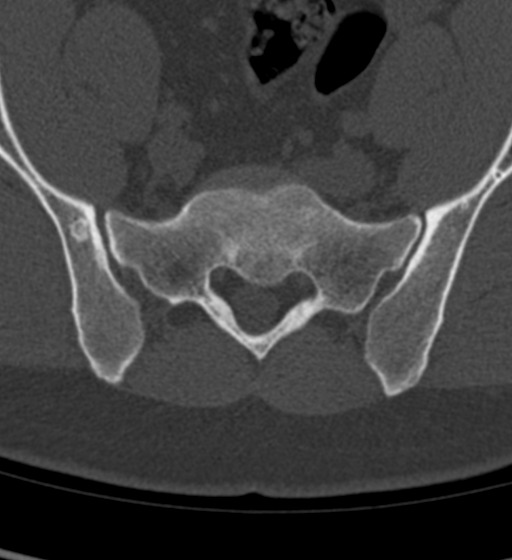
[im 45/134  bone]
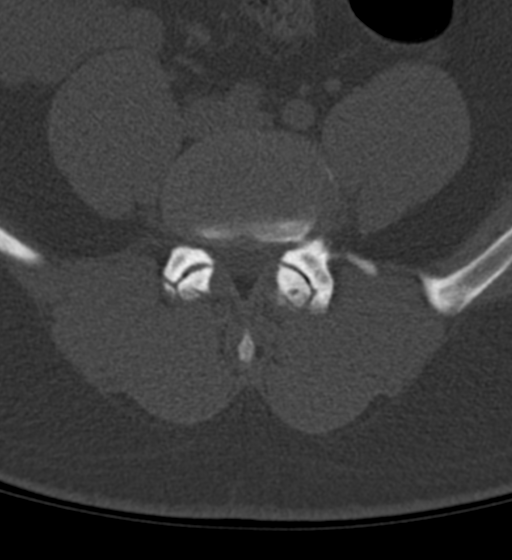
[im 89/134  bone]
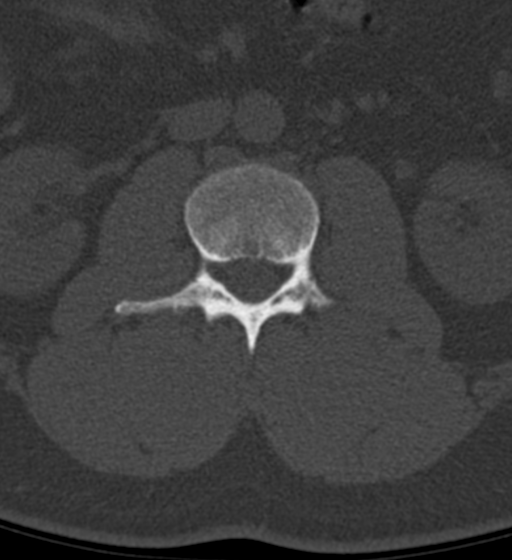
[im 111/134  bone]
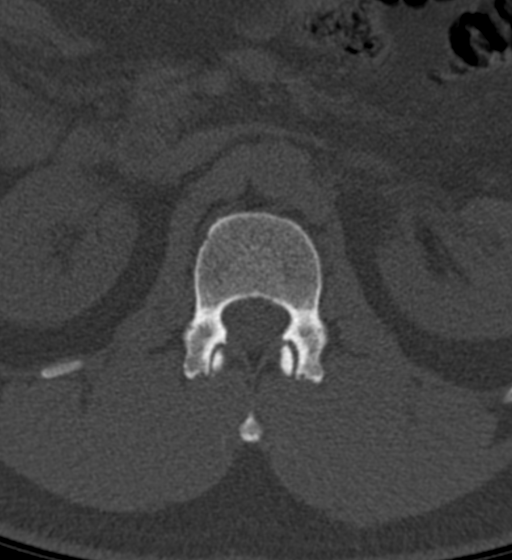

[12 of 33 positions shown; findings below may reference images not displayed]

FINDINGS: Segmentation: 5 non rib-bearing lumbar type vertebral bodies are
present. The lowest fully formed vertebral body is L5.

Alignment: No significant listhesis is present. There is minimal
straightening of the normal lumbar lordosis. Slight rightward
curvature is noted.

Vertebrae: Vertebral body heights are maintained. No acute or
healing fractures are present.

Paraspinal and other soft tissues: Limited imaging the abdomen is
unremarkable. There is no significant adenopathy. No solid organ
lesions are present.

Disc levels: L1-2: Mild facet hypertrophy is present bilaterally. No
significant disc protrusion or stenosis is present.

L2-3: Mild facet hypertrophy is present bilaterally. No significant
disc protrusion or stenosis is present.

L3-4: Mild facet hypertrophy is present bilaterally. There is slight
disc bulging. No significant stenosis is present.

L4-5: A broad-based disc protrusion is present. Moderate facet
hypertrophy is worse on the left. Mild central and moderate
bilateral foraminal narrowing is present.

L5-S1: Mild facet hypertrophy is worse on the right. No significant
disc protrusion or stenosis is present.
IMPRESSION: 1. Broad-based disc protrusion and moderate facet hypertrophy at
L4-5 leads to mild central and moderate bilateral foraminal
narrowing.
2. Mild disc bulging and bilateral facet hypertrophy at L3-4 without
significant stenosis.
3. Mild facet hypertrophy at L1-2, L2-3, and L5-S1 without
significant stenosis.
4. No acute or healing fractures.

## 2020-09-03 IMAGING — MR MR LUMBAR SPINE W/O CM
4 of 5 series · 26 of 48 positions shown · non-contrast
Comparison: MRI of lumbar spine [DATE]. CT of the lumbar spine
without contrast [DATE].

CLINICAL DATA: Left-sided low back pain. Pain after injury last
[REDACTED]. Recent steroid injections without benefit.

EXAM:
MRI LUMBAR SPINE WITHOUT CONTRAST
TECHNIQUE: Multiplanar, multisequence MR imaging of the lumbar spine was
performed. No intravenous contrast was administered.

[Series 4: T2 · sagittal · 4.0mm · 0.81mm/px · 5 of 15 slices shown (1 of 2)]
[im 1/15]
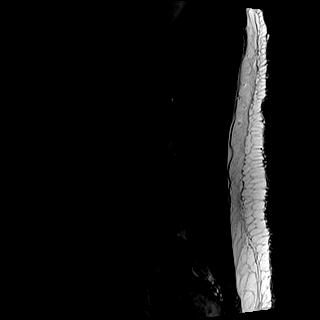
[im 4/15]
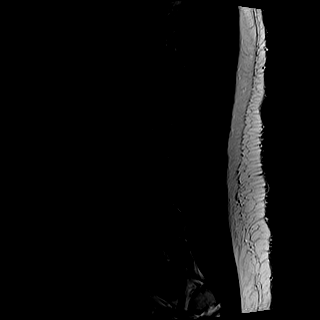
[im 8/15]
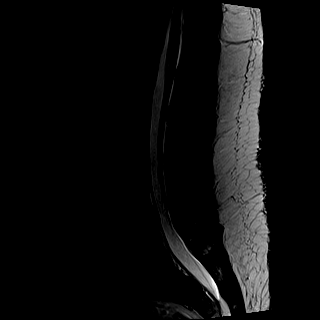
[im 11/15]
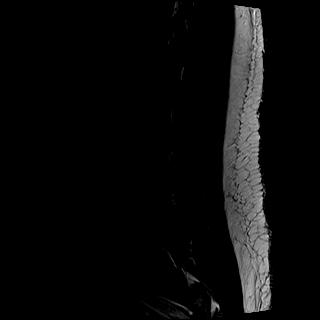
[im 15/15]
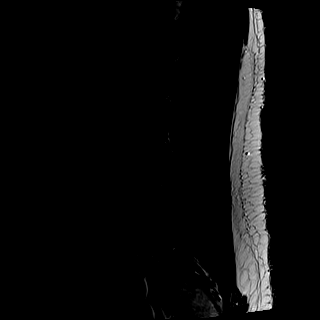

[Series 5: T1 · sagittal · 4.0mm · 0.41mm/px · 5 of 15 slices shown (1 of 2)]
[im 1/15]
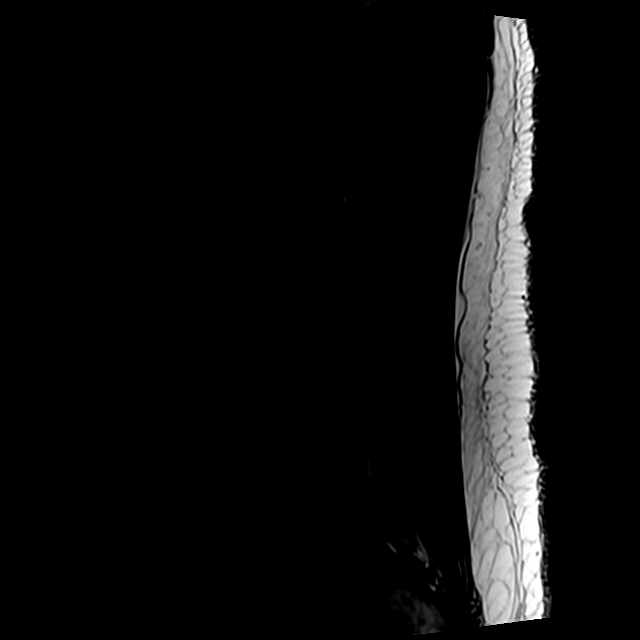
[im 4/15]
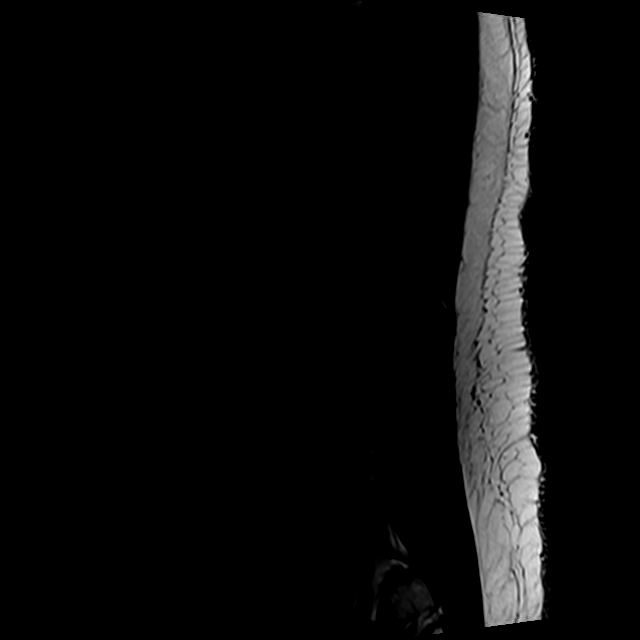
[im 8/15]
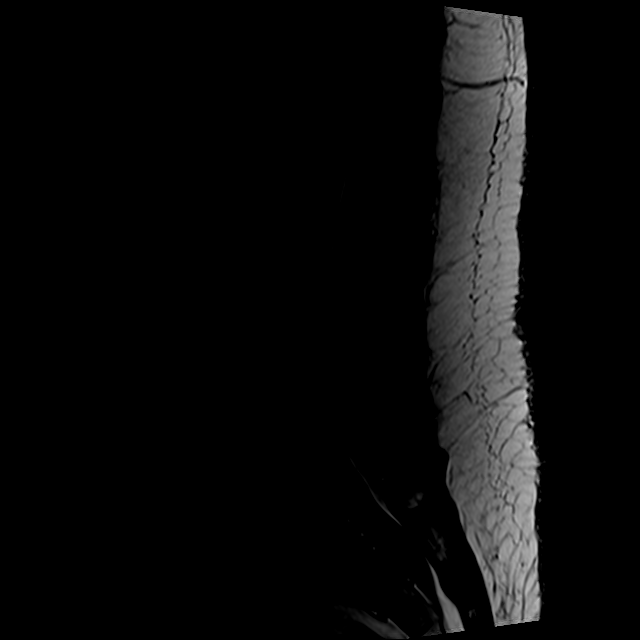
[im 11/15]
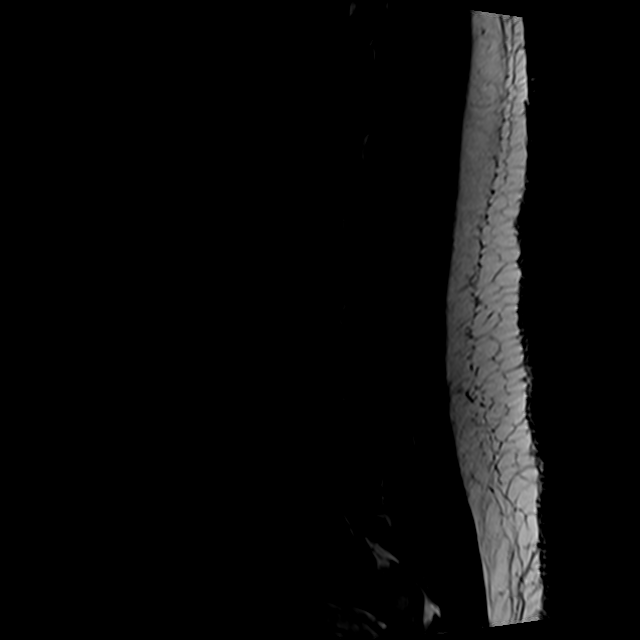
[im 15/15]
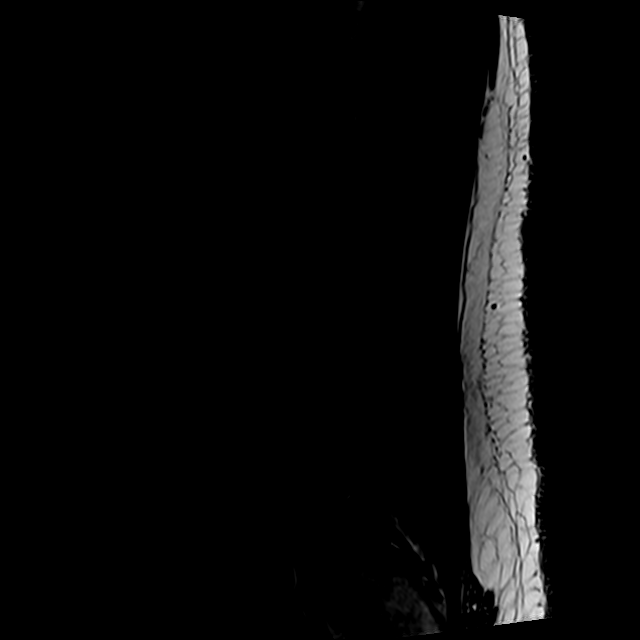

[Series 9: T2 · axial · 4.0mm · 0.78mm/px · z∈[-136,+97]mm · 10 of 42 slices shown (2 of 2)]
[im 3/42]
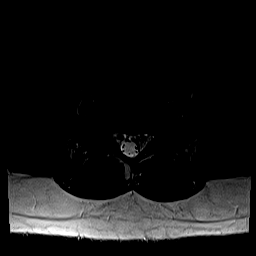
[im 6/42]
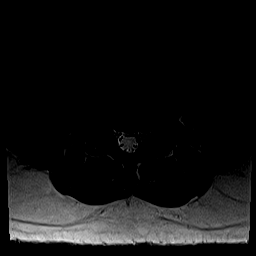
[im 9/42]
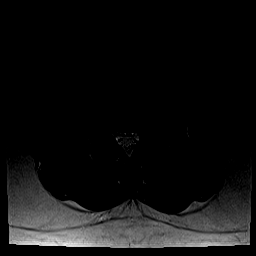
[im 14/42]
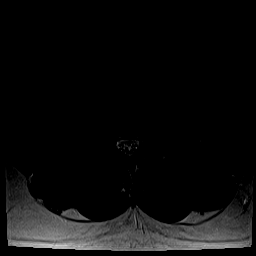
[im 20/42]
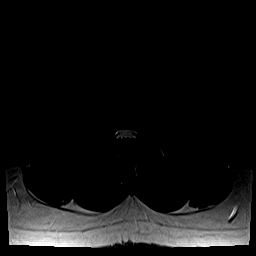
[im 22/42]
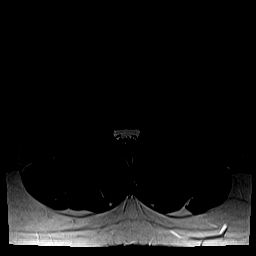
[im 25/42]
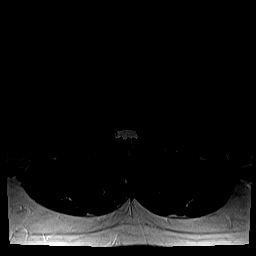
[im 31/42]
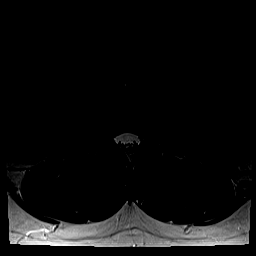
[im 36/42]
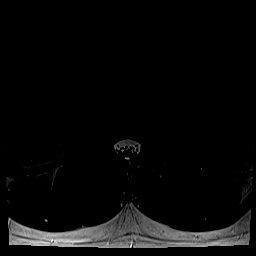
[im 42/42]
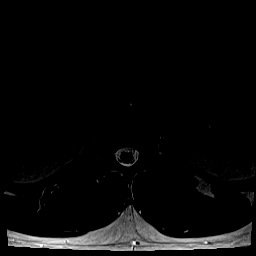

[Series 12: T1 · axial · 4.0mm · 0.39mm/px · z∈[-136,+67]mm · 6 of 42 slices shown (2 of 2)]
[im 3/42]
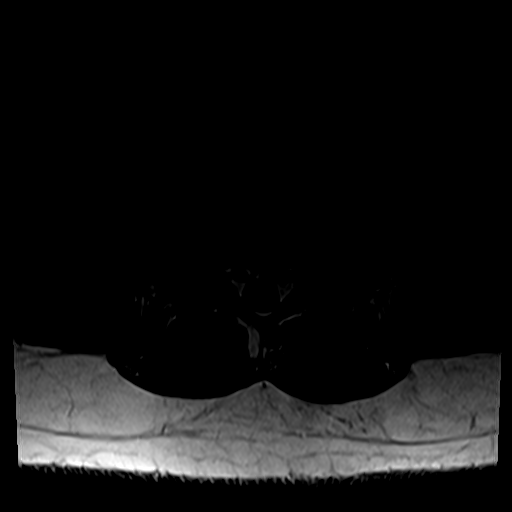
[im 6/42]
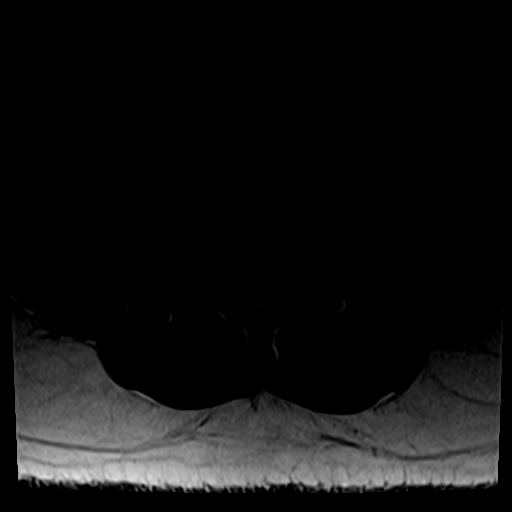
[im 9/42]
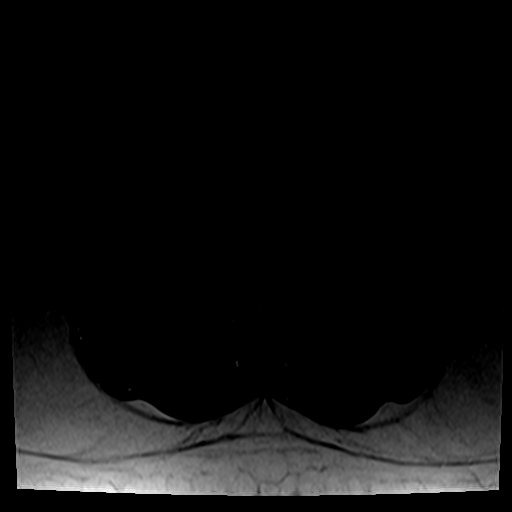
[im 14/42]
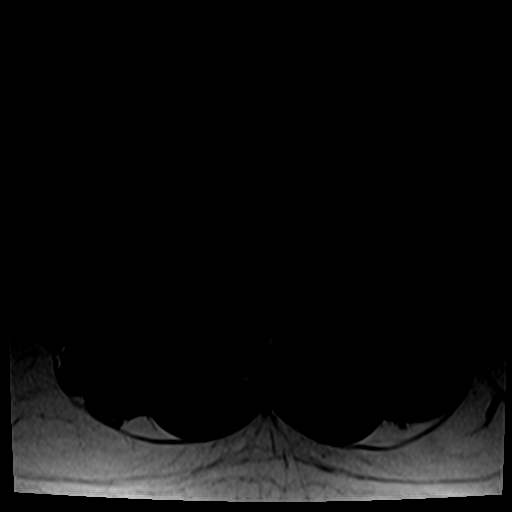
[im 22/42]
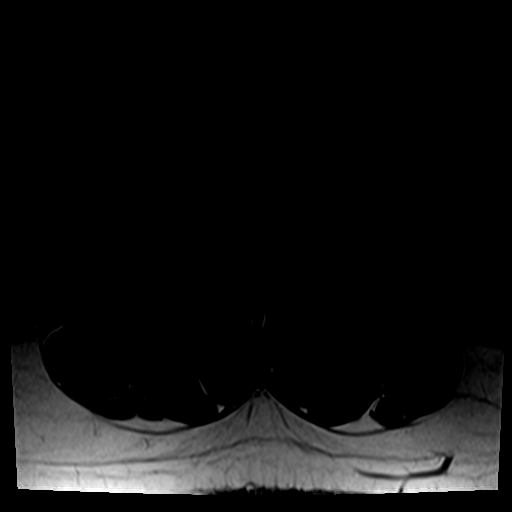
[im 36/42]
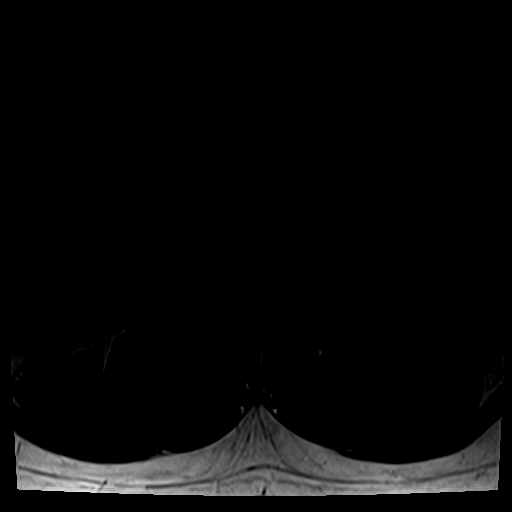

[26 of 48 positions shown; findings below may reference images not displayed]

FINDINGS: Segmentation: 5 non rib-bearing lumbar type vertebral bodies are
present. The lowest fully formed vertebral body is L5.

Alignment: No significant listhesis is present. Minimal
straightening of the normal lumbar lordosis is stable. Slight
rightward curvature is noted.

Vertebrae:  Marrow signal and vertebral body heights are normal.

Conus medullaris and cauda equina: Conus extends to the L1 level.
Conus and cauda equina appear normal.

Paraspinal and other soft tissues: Limited imaging the abdomen is
unremarkable. There is no significant adenopathy. No solid organ
lesions are present.

Disc levels:

L1-2: Negative.

L2-3: Mild facet hypertrophy is worse on the left. No significant
stenosis is present.

L3-4: Slight disc bulging mild bilateral facet hypertrophy is noted.
No significant focal disc protrusion or stenosis is present.

L4-5: A broad-based disc protrusion is present. Moderate facet
hypertrophy is noted. Progressive mild central and moderate
bilateral foraminal stenosis is present. Foraminal narrowing is
worse on the left. Facet hypertrophy is worse on the left.

L5-S1: Mild right-sided facet hypertrophy is present. No significant
disc protrusion or stenosis is present.
IMPRESSION: 1. Progressive mild central and moderate bilateral foraminal
stenosis at L4-5 secondary to a broad-based disc protrusion and
moderate facet hypertrophy. Foraminal narrowing is worse on the
left.
2. Slight disc bulging and facet hypertrophy at L3-4 without
significant stenosis.
3. Mild right-sided facet hypertrophy at L2-3 and L5-S1 without
significant stenosis.

## 2020-09-08 ENCOUNTER — Other Ambulatory Visit: Payer: BC Managed Care – PPO

## 2020-09-09 ENCOUNTER — Other Ambulatory Visit: Payer: BC Managed Care – PPO

## 2020-11-20 ENCOUNTER — Other Ambulatory Visit: Payer: Self-pay | Admitting: Physical Medicine and Rehabilitation

## 2020-11-21 ENCOUNTER — Other Ambulatory Visit: Payer: Self-pay | Admitting: Physical Medicine and Rehabilitation

## 2020-11-21 DIAGNOSIS — M533 Sacrococcygeal disorders, not elsewhere classified: Secondary | ICD-10-CM

## 2020-12-07 ENCOUNTER — Ambulatory Visit
Admission: RE | Admit: 2020-12-07 | Discharge: 2020-12-07 | Disposition: A | Discharge status: Home / Self Care | Payer: Worker's Compensation | Source: Ambulatory Visit | Attending: Physical Medicine and Rehabilitation | Admitting: Physical Medicine and Rehabilitation

## 2020-12-07 ENCOUNTER — Other Ambulatory Visit: Payer: Self-pay

## 2020-12-07 DIAGNOSIS — M533 Sacrococcygeal disorders, not elsewhere classified: Secondary | ICD-10-CM

## 2020-12-07 IMAGING — CT CT BIOPSY
1 of 4 series · 9 of 32 positions shown, 15 images · non-contrast
Comparison: none

CLINICAL DATA: Left sacroiliac pain X 1 year, no previous surgery

[Series 2: needle -guided injection · axial · 0.77mm/px · z∈[+937,+1009]mm · 9 of 46 slices shown, 15 images]
[im 5/46  soft-tissue]
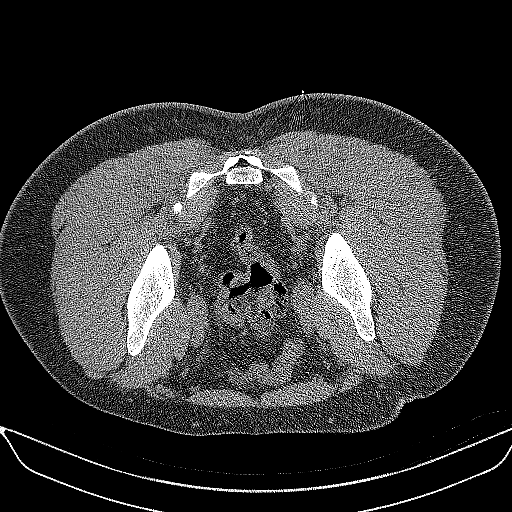
[im 5/46  bone]
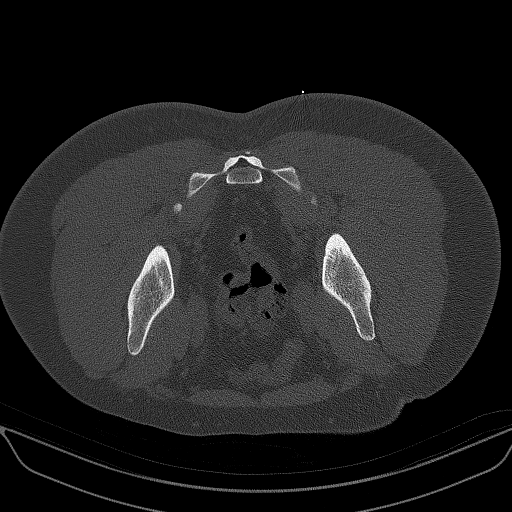
[im 10/46  soft-tissue]
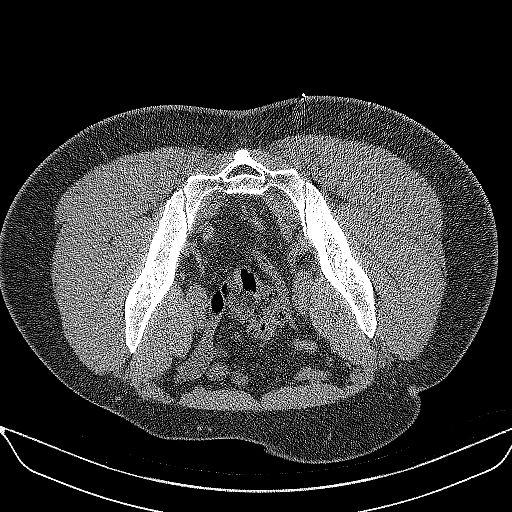
[im 14/46  soft-tissue]
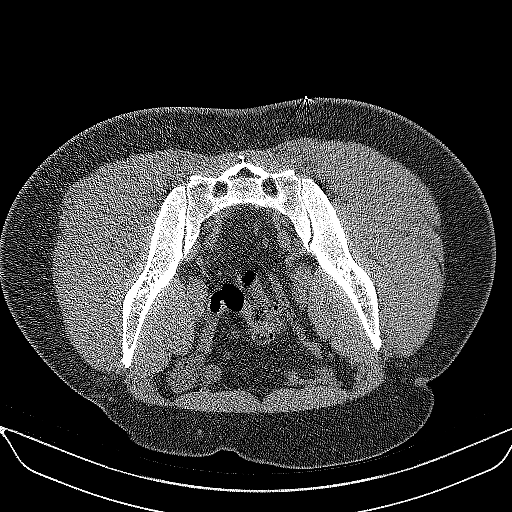
[im 19/46  soft-tissue]
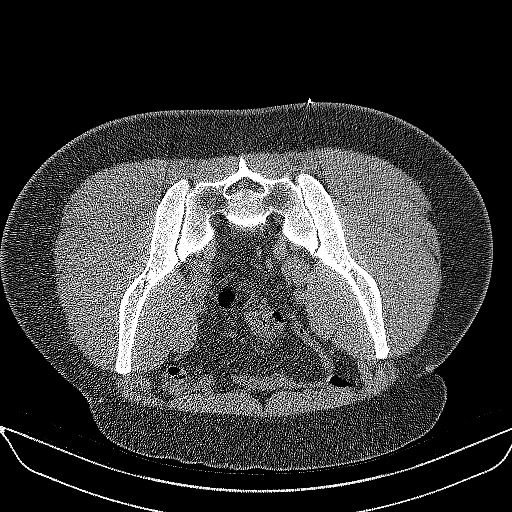
[im 23/46  soft-tissue]
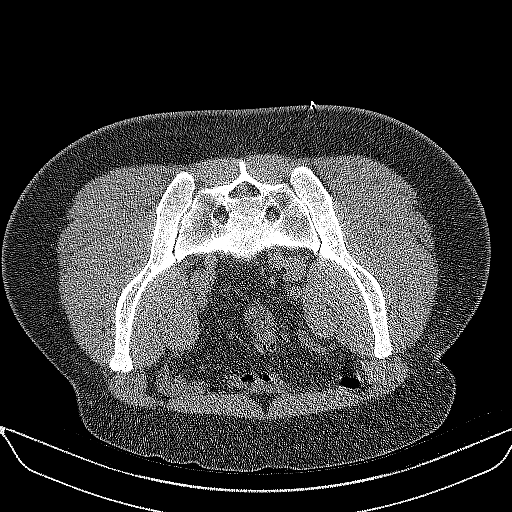
[im 28/46  soft-tissue]
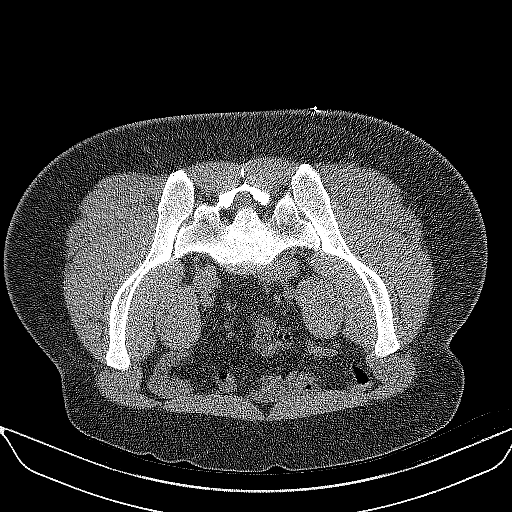
[im 28/46  lung]
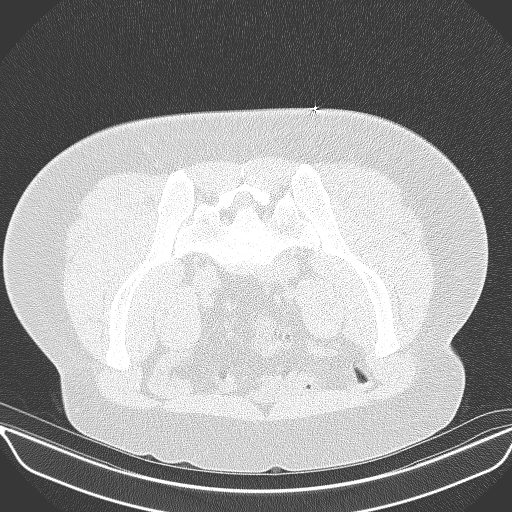
[im 32/46  soft-tissue]
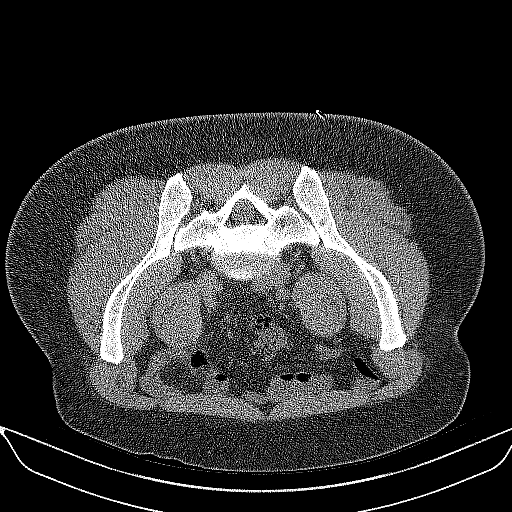
[im 32/46  lung]
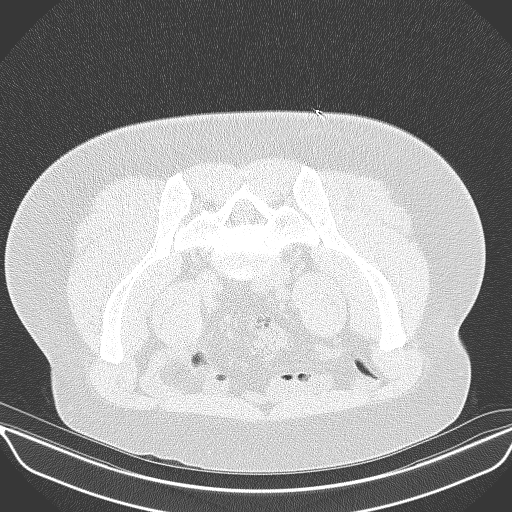
[im 37/46  soft-tissue]
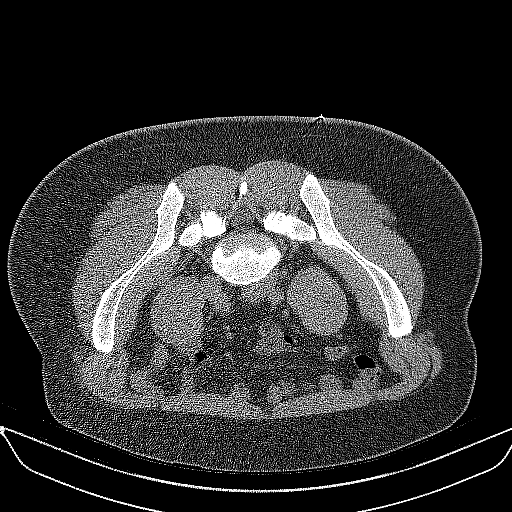
[im 37/46  lung]
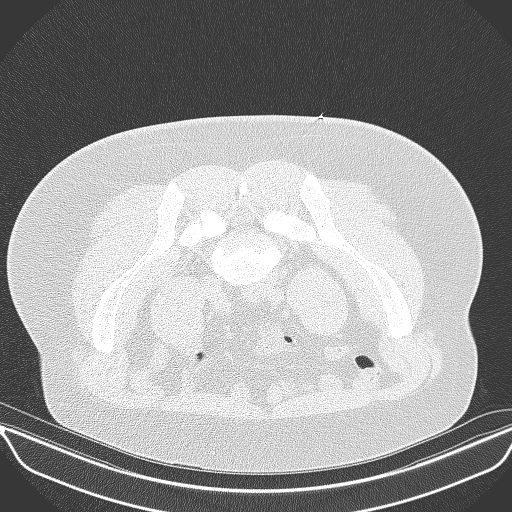
[im 41/46  soft-tissue]
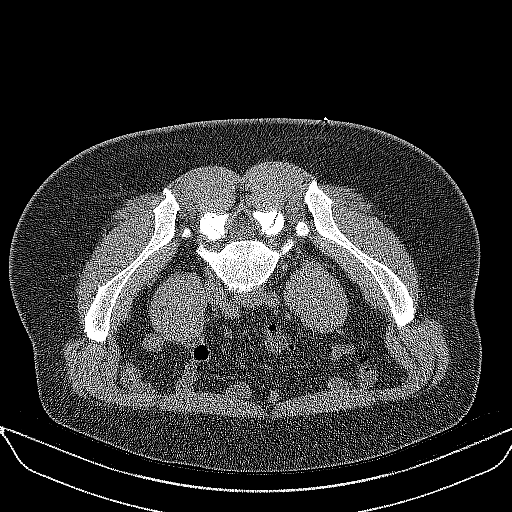
[im 41/46  lung]
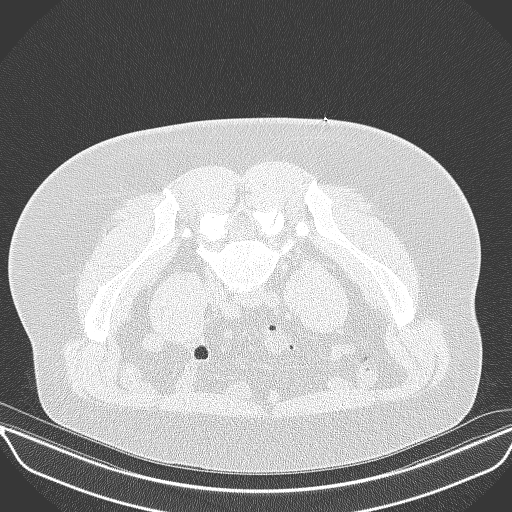
[im 41/46  bone]
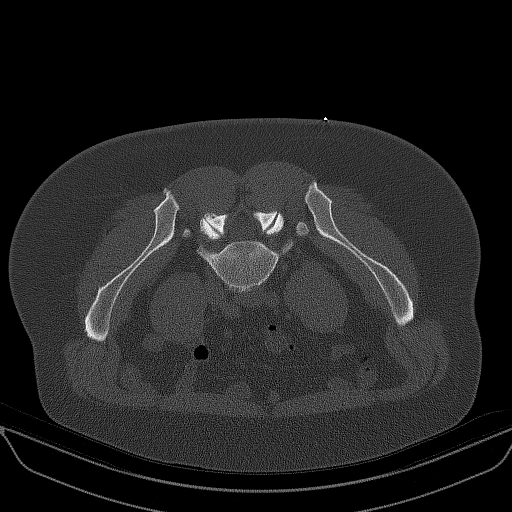

[9 of 32 positions shown; findings below may reference images not displayed]

EXAM:
LEFT CT GUIDED SI JOINT INJECTION



After local anesthesia with 1% lidocaine without epinephrine and
subsequent deep anesthesia, a 22 gauge 3.5 inch spinal needle was
advanced into the posteroinferior margin SI joint under intermittent
CT guidance.

Once the needle was in satisfactory position, representative image
was captured with the needle demonstrated in the sacroiliac joint.
Subsequently, 3 mL bupivacaine 0.5% was injected into the left SI
joint. Needle removed and a sterile dressing applied.

No complications were observed.
IMPRESSION: Successful CT-guided left SI joint injection.

## 2021-01-25 ENCOUNTER — Other Ambulatory Visit: Payer: Self-pay

## 2021-01-25 ENCOUNTER — Other Ambulatory Visit: Payer: Self-pay | Admitting: Orthopedic Surgery

## 2021-01-25 ENCOUNTER — Ambulatory Visit
Admission: RE | Admit: 2021-01-25 | Discharge: 2021-01-25 | Disposition: A | Discharge status: Home / Self Care | Payer: Worker's Compensation | Source: Ambulatory Visit | Attending: Orthopedic Surgery | Admitting: Orthopedic Surgery

## 2021-01-25 DIAGNOSIS — M4328 Fusion of spine, sacral and sacrococcygeal region: Secondary | ICD-10-CM

## 2021-01-25 IMAGING — CT CT PELVIS W/O CM
1 series · 16 of 32 positions shown, 20 images · non-contrast
Comparison: [DATE]

CLINICAL DATA: Severe left leg pain.  Recent sacroiliac surgery.

EXAM:
CT PELVIS WITHOUT CONTRAST
TECHNIQUE: Multidetector CT imaging of the pelvis was performed following the
standard protocol without intravenous contrast.

[Series 3: soft tissue pelvis/hip (person_name) · axial · 0.79mm/px · z∈[-264,-52]mm · 16 of 80 slices shown, 20 images]
[im 6/80  soft-tissue]
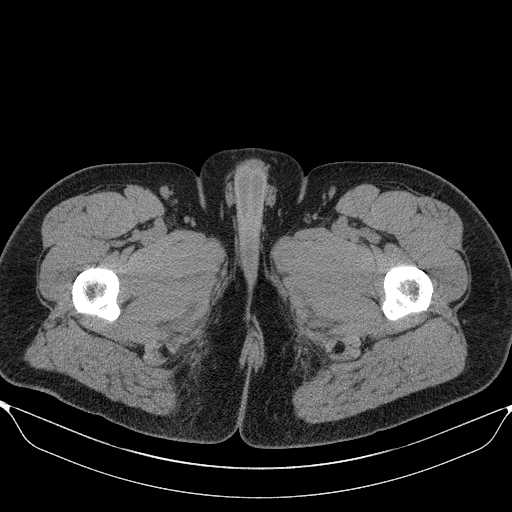
[im 6/80  bone]
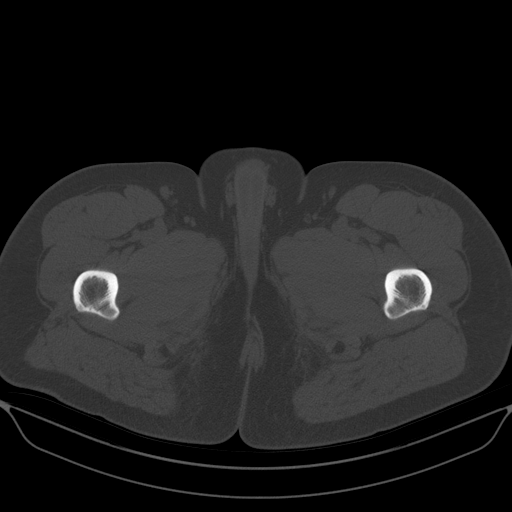
[im 11/80  soft-tissue]
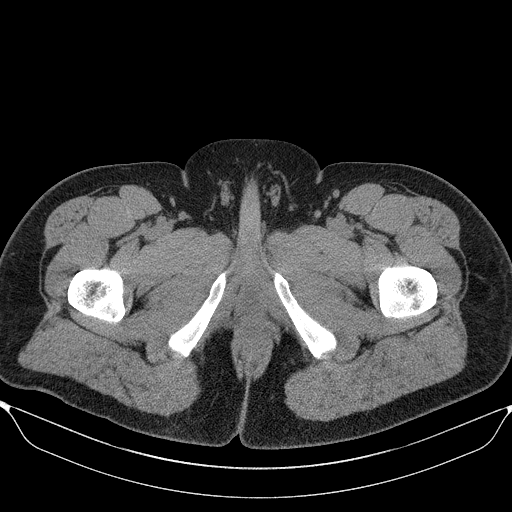
[im 16/80  soft-tissue]
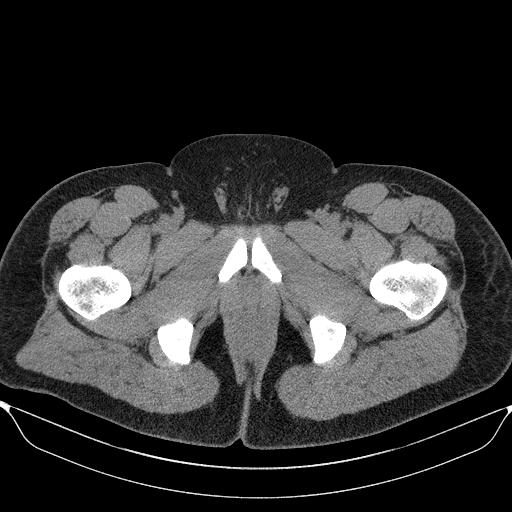
[im 21/80  soft-tissue]
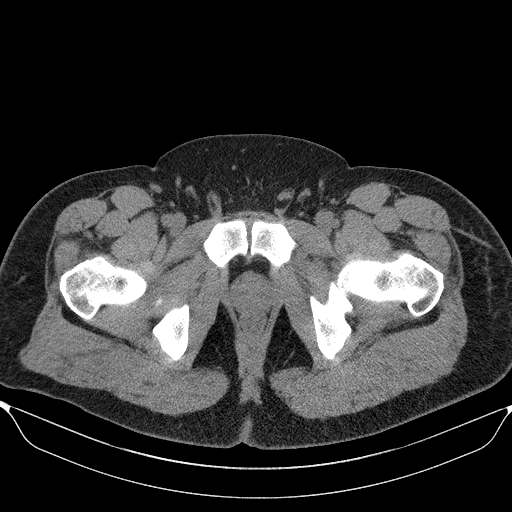
[im 26/80  soft-tissue]
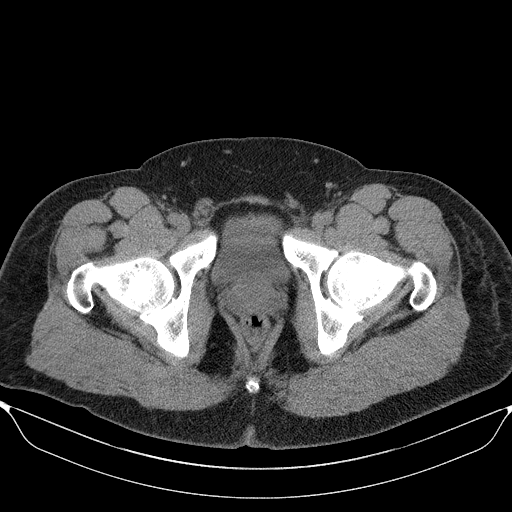
[im 31/80  soft-tissue]
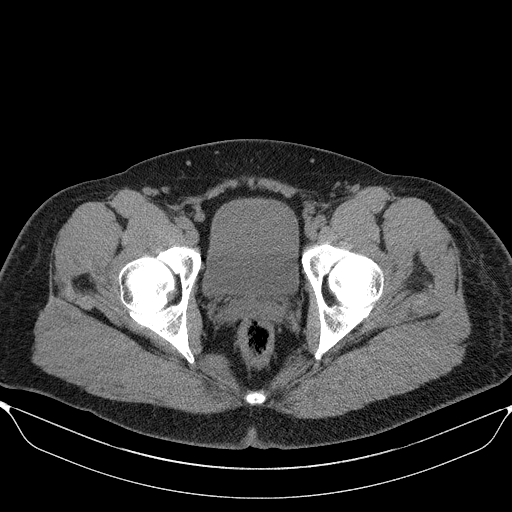
[im 36/80  soft-tissue]
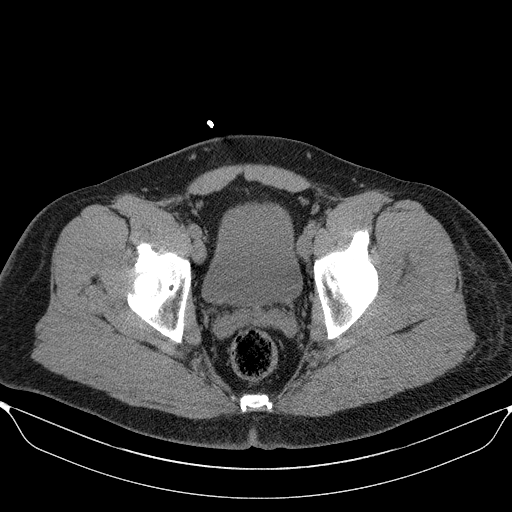
[im 44/80  soft-tissue]
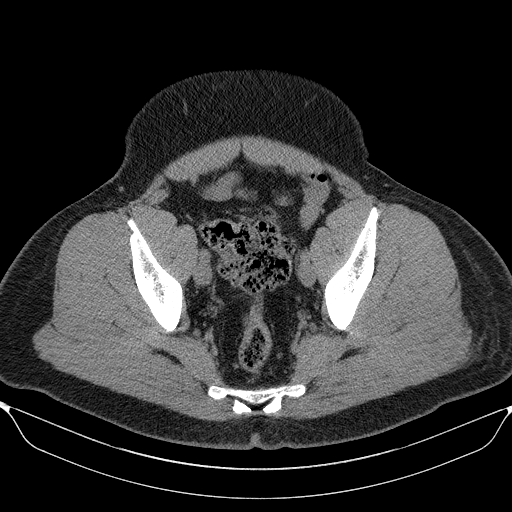
[im 49/80  soft-tissue]
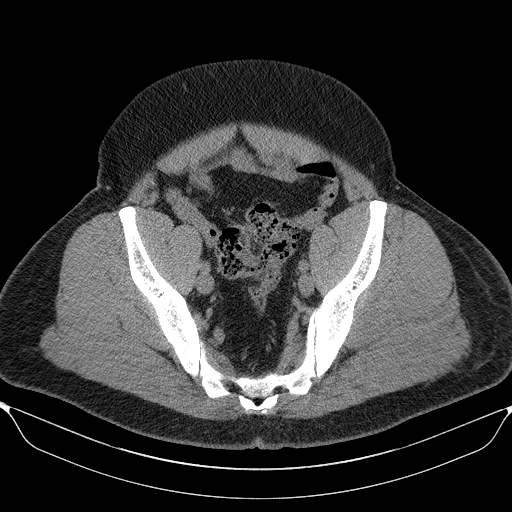
[im 49/80  bone]
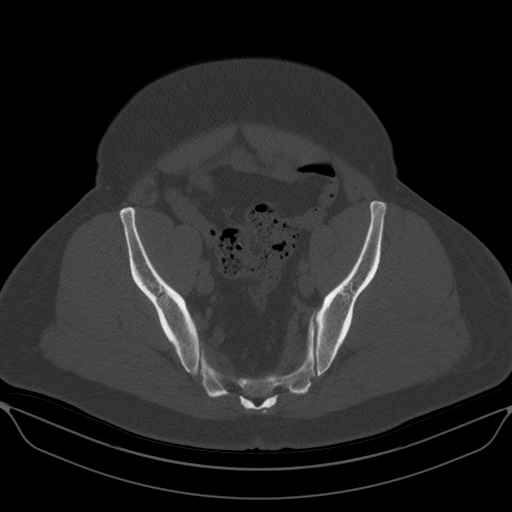
[im 54/80  soft-tissue]
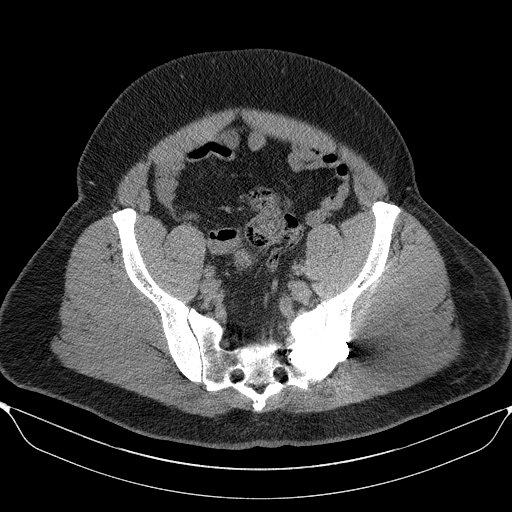
[im 59/80  soft-tissue]
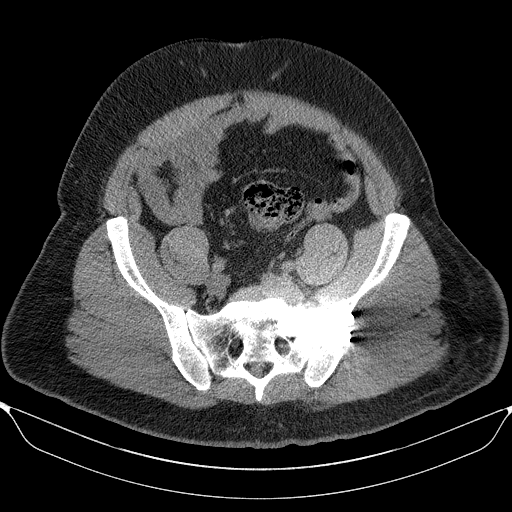
[im 64/80  soft-tissue]
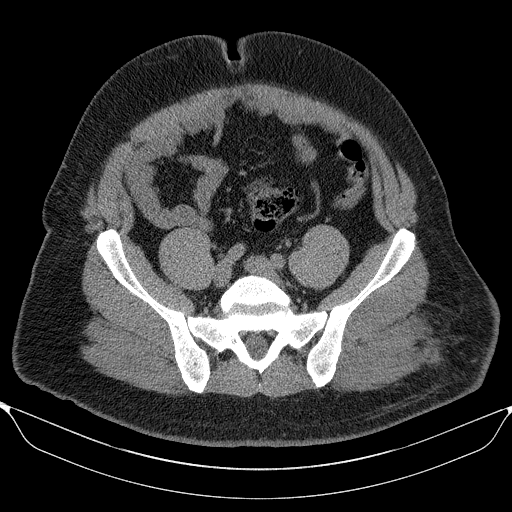
[im 69/80  soft-tissue]
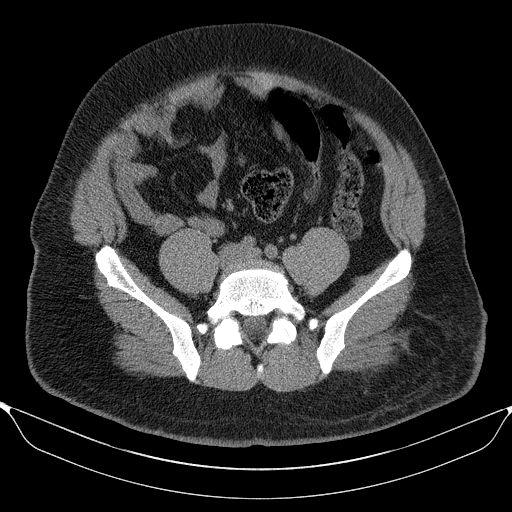
[im 69/80  lung]
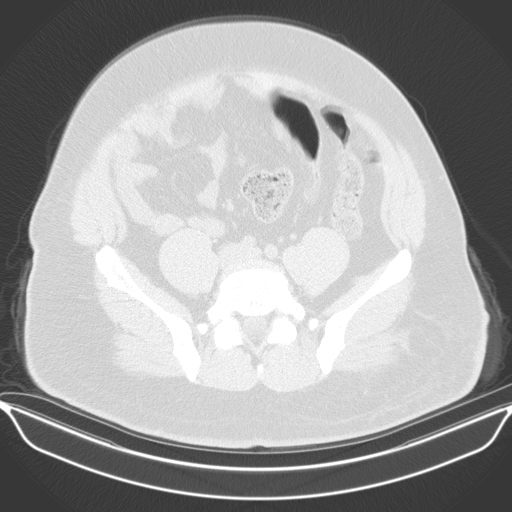
[im 72/80  lung]
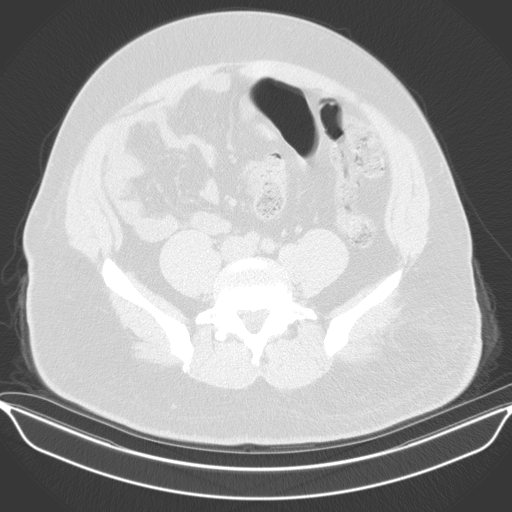
[im 74/80  soft-tissue]
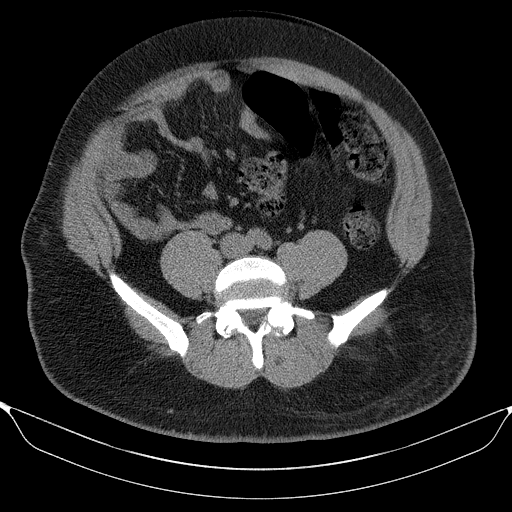
[im 74/80  lung]
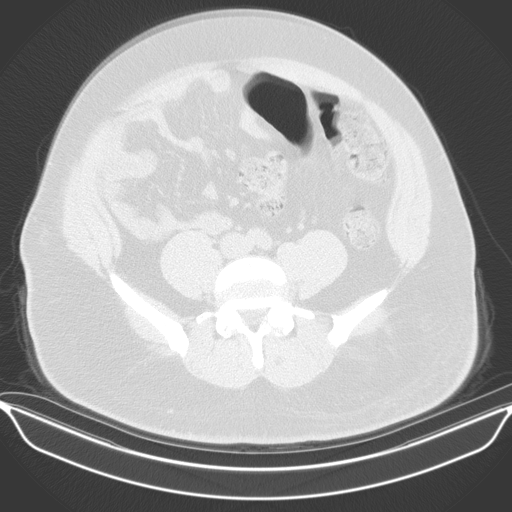
[im 77/80  lung]
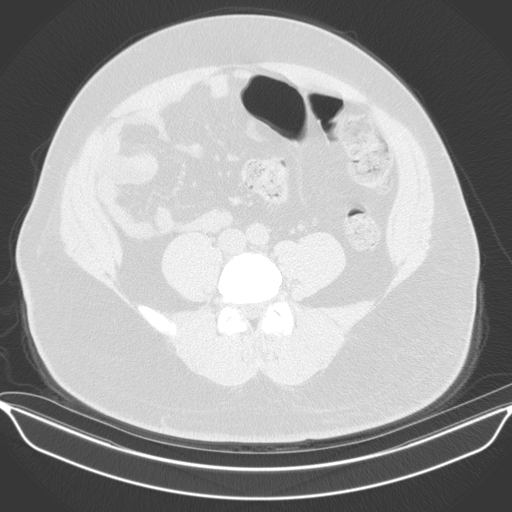

[16 of 32 positions shown; findings below may reference images not displayed]

FINDINGS: Urinary Tract:  No abnormality visualized.

Bowel:  Unremarkable visualized pelvic bowel loops.

Vascular/Lymphatic: No pathologically enlarged lymph nodes. No
significant vascular abnormality seen.

Reproductive:  Prostate unremarkable

Other:  No ascites.

Musculoskeletal: Mild facet DJD in the visualized lower lumbar
spine. Interval placement of 3 metallic fusion devices across the
left SI joint, without fracture, encroachment into the sacral
foramina, or other apparent complication.
IMPRESSION: 1. No acute findings.
2. Postop changes across the left SI joint as above.

## 2021-01-26 ENCOUNTER — Other Ambulatory Visit: Payer: Self-pay | Admitting: Orthopedic Surgery

## 2021-04-24 ENCOUNTER — Other Ambulatory Visit: Payer: Self-pay | Admitting: Orthopedic Surgery

## 2021-04-24 DIAGNOSIS — M545 Low back pain, unspecified: Secondary | ICD-10-CM

## 2021-05-22 ENCOUNTER — Other Ambulatory Visit: Payer: Self-pay

## 2021-05-22 ENCOUNTER — Ambulatory Visit
Admission: RE | Admit: 2021-05-22 | Discharge: 2021-05-22 | Disposition: A | Discharge status: Home / Self Care | Payer: Worker's Compensation | Source: Ambulatory Visit | Attending: Orthopedic Surgery | Admitting: Orthopedic Surgery

## 2021-05-22 DIAGNOSIS — M545 Low back pain, unspecified: Secondary | ICD-10-CM

## 2021-05-22 IMAGING — MR MR LUMBAR SPINE W/O CM
4 of 5 series · 18 of 48 positions shown · non-contrast
Comparison: MRI lumbar [DATE].

CLINICAL DATA: Low back pain radiating to left leg since surgery of
sacroiliac joint fusion [DATE].

EXAM:
MRI LUMBAR SPINE WITHOUT CONTRAST
TECHNIQUE: Multiplanar, multisequence MR imaging of the lumbar spine was
performed. No intravenous contrast was administered.

[Series 5: T2 · sagittal · 4.0mm · 0.88mm/px · 5 of 15 slices shown (1 of 2)]
[im 1/15]
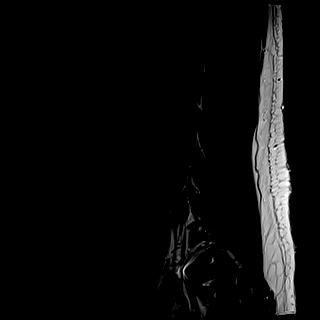
[im 4/15]
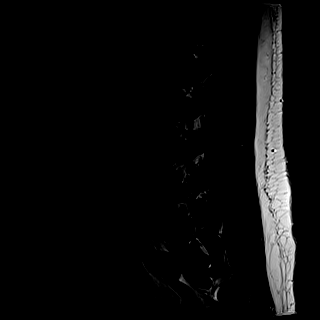
[im 8/15]
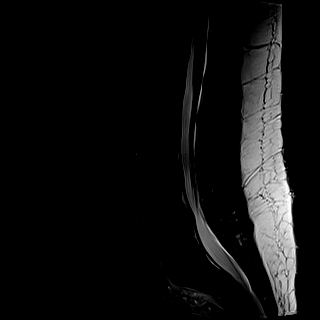
[im 11/15]
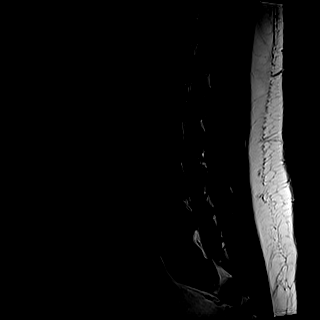
[im 15/15]
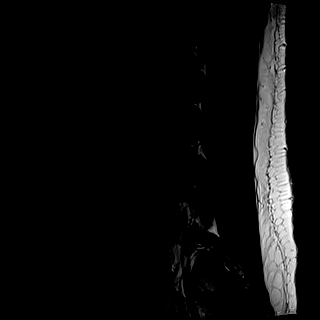

[Series 6: T1 · sagittal · 4.0mm · 0.88mm/px · 3 of 15 slices shown (1 of 2)]
[im 1/15]
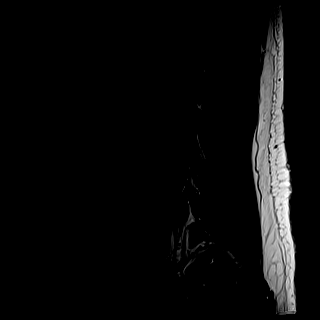
[im 8/15]
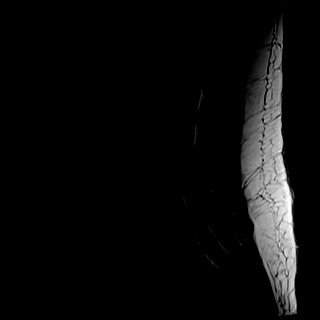
[im 15/15]
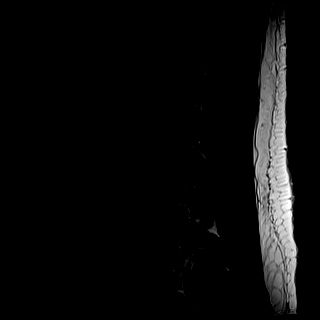

[Series 10: T1 · axial · 4.0mm · 0.28mm/px · z∈[-119,+51]mm · 3 of 42 slices shown (2 of 2)]
[im 6/42]
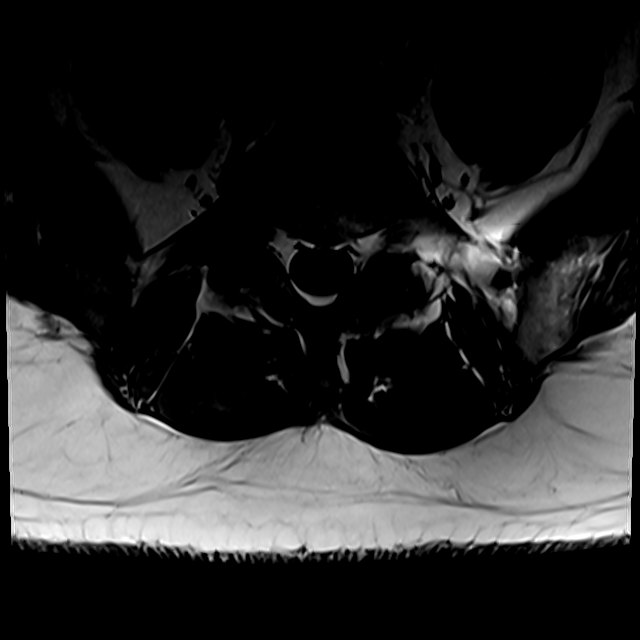
[im 22/42]
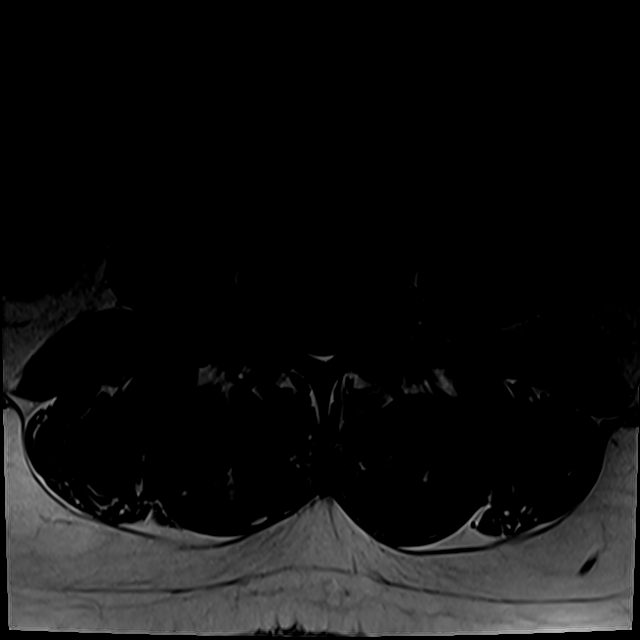
[im 36/42]
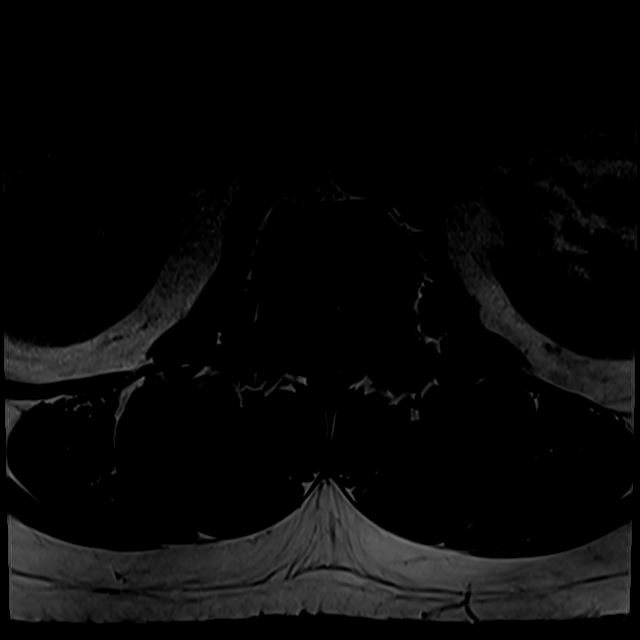

[Series 13: T2 · axial · 4.0mm · 0.28mm/px · z∈[-134,+51]mm · 7 of 42 slices shown (2 of 2)]
[im 3/42]
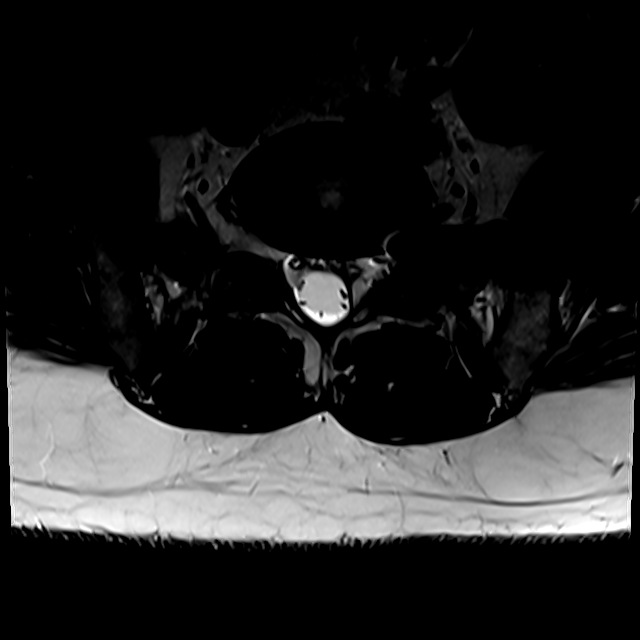
[im 6/42]
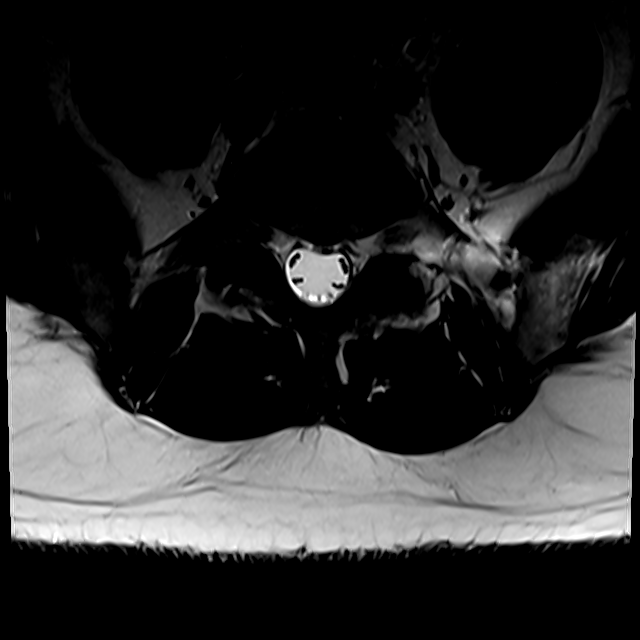
[im 9/42]
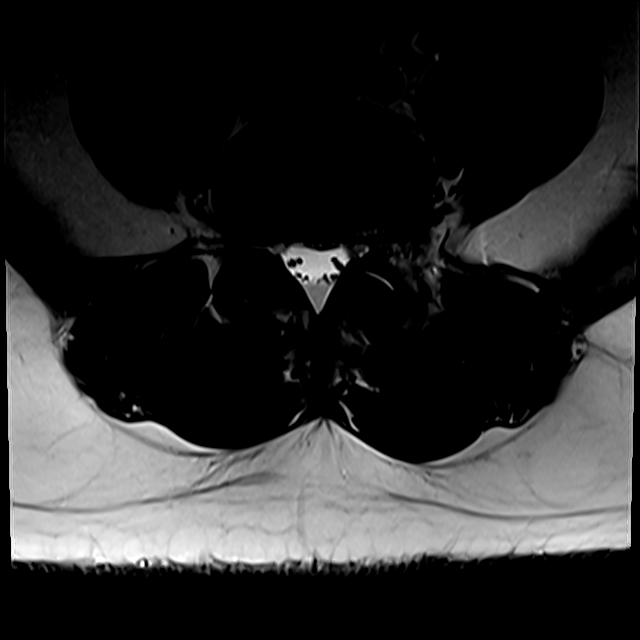
[im 14/42]
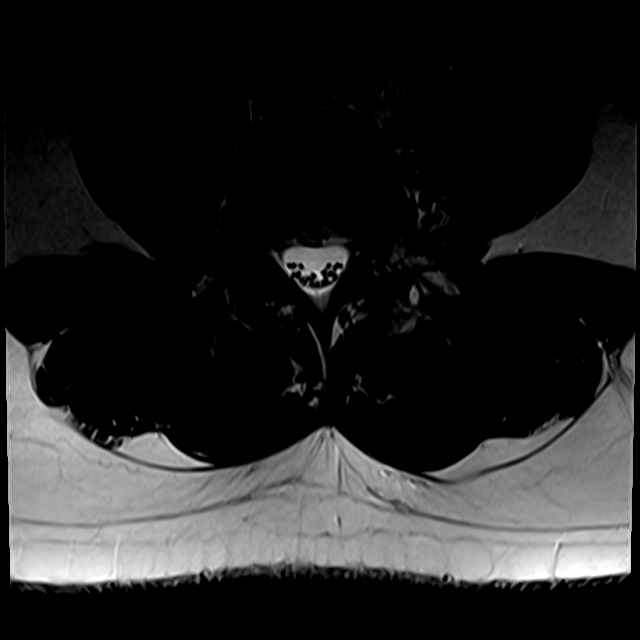
[im 20/42]
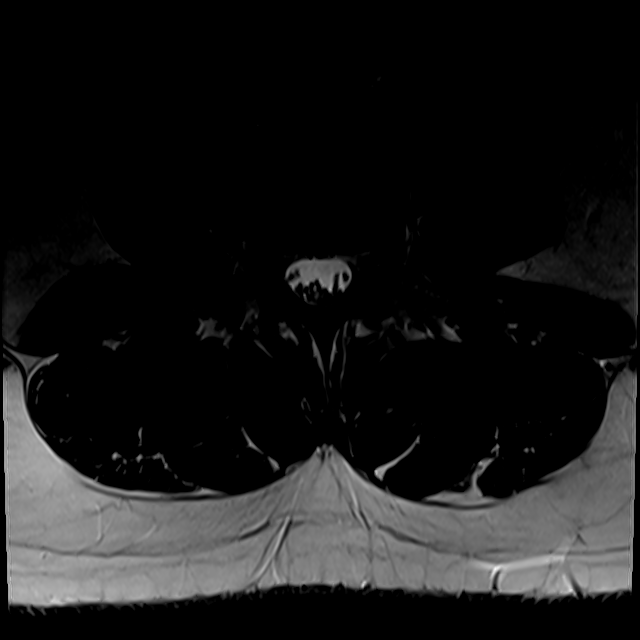
[im 22/42]
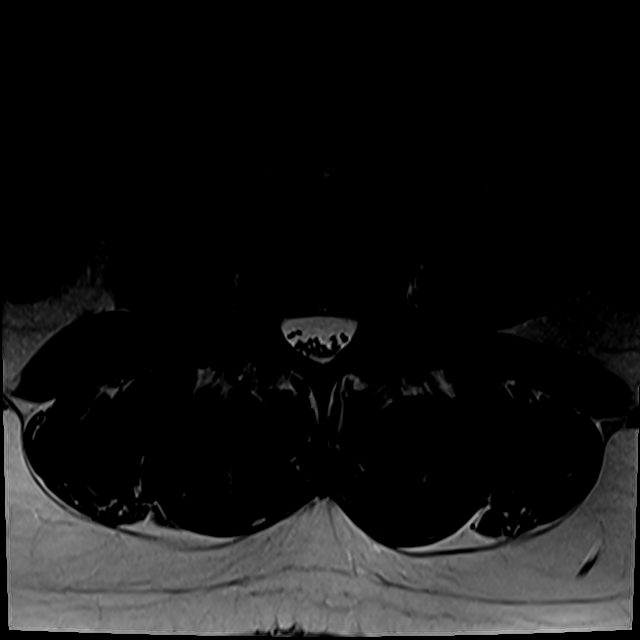
[im 36/42]
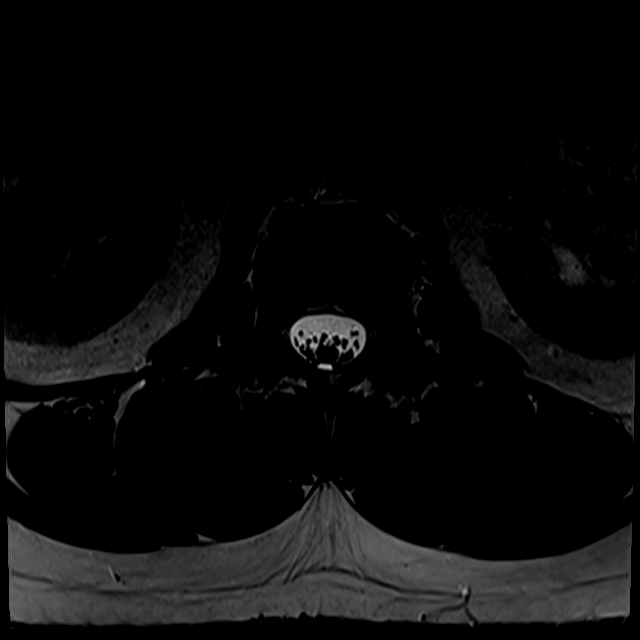

[18 of 48 positions shown; findings below may reference images not displayed]

FINDINGS: Segmentation:  Standard.

Alignment:  Stable and preserved.

Vertebrae: Vertebral body heights are stable and preserved. There is
marrow edema at the left L4 and L5 pedicles and left greater than
right L4-L5 facets. This was present to a lesser extent on the prior
study in retrospect. Likely due to facet arthropathy and mechanical
stress. Marrow signal is otherwise within normal limits. Partially
imaged susceptibility artifact from left sacroiliac joint fusion.

Conus medullaris and cauda equina: Conus extends to the L1 level.
Conus and cauda equina appear normal.

Paraspinal and other soft tissues: Mild left paraspinal edema
adjacent to L4-L5 facets.

Disc levels:

L1-L2:  No stenosis.

L2-L3:  Mild facet arthropathy.  No stenosis.

L3-L4:  Mild facet arthropathy.  No stenosis.

L4-L5: Disc bulge with punctate right foraminal annular fissure.
Left greater than right moderate facet arthropathy. Minor canal
stenosis. Mild foraminal stenosis.

L5-S1: Mild right greater than left facet arthropathy. No canal or
foraminal stenosis.
IMPRESSION: Overall mild degenerative changes as detailed above are similar to
prior study. Multilevel facet arthropathy, greatest on the left at
L4-L5. There is associated marrow and paraspinal edema primarily on
the left.
